# Patient Record
Sex: Female | Born: 1950 | Race: White | Hispanic: No | Smoking: Former smoker
Health system: Southern US, Community
[De-identification: ages and names within clinical notes are randomized; demographics above are authoritative.]

## PROBLEM LIST (undated history)

## (undated) DIAGNOSIS — M1990 Unspecified osteoarthritis, unspecified site: Secondary | ICD-10-CM

## (undated) DIAGNOSIS — I10 Essential (primary) hypertension: Secondary | ICD-10-CM

## (undated) DIAGNOSIS — N200 Calculus of kidney: Secondary | ICD-10-CM

## (undated) DIAGNOSIS — E039 Hypothyroidism, unspecified: Secondary | ICD-10-CM

## (undated) HISTORY — PX: TUBAL LIGATION: SHX77

## (undated) HISTORY — PX: KIDNEY STONE SURGERY: SHX686

## (undated) HISTORY — PX: TONSILLECTOMY: SUR1361

## (undated) HISTORY — PX: APPENDECTOMY: SHX54

## (undated) HISTORY — PX: OTHER SURGICAL HISTORY: SHX169

## (undated) HISTORY — PX: CHOLECYSTECTOMY: SHX55

---

## 1997-12-02 ENCOUNTER — Other Ambulatory Visit: Admission: RE | Admit: 1997-12-02 | Discharge: 1997-12-02 | Payer: Self-pay | Admitting: Internal Medicine

## 1997-12-05 ENCOUNTER — Emergency Department (HOSPITAL_COMMUNITY): Admission: EM | Admit: 1997-12-05 | Discharge: 1997-12-05 | Payer: Self-pay | Admitting: Emergency Medicine

## 1997-12-07 ENCOUNTER — Observation Stay (HOSPITAL_COMMUNITY): Admission: EM | Admit: 1997-12-07 | Discharge: 1997-12-08 | Payer: Self-pay | Admitting: Emergency Medicine

## 2001-06-27 ENCOUNTER — Other Ambulatory Visit: Admission: RE | Admit: 2001-06-27 | Discharge: 2001-06-27 | Payer: Self-pay | Admitting: Internal Medicine

## 2002-09-22 ENCOUNTER — Other Ambulatory Visit: Admission: RE | Admit: 2002-09-22 | Discharge: 2002-09-22 | Payer: Self-pay | Admitting: Internal Medicine

## 2005-08-02 ENCOUNTER — Ambulatory Visit: Payer: Self-pay | Admitting: Gastroenterology

## 2005-08-28 ENCOUNTER — Other Ambulatory Visit: Admission: RE | Admit: 2005-08-28 | Discharge: 2005-08-28 | Payer: Self-pay | Admitting: Internal Medicine

## 2005-09-26 ENCOUNTER — Ambulatory Visit (HOSPITAL_COMMUNITY): Admission: RE | Admit: 2005-09-26 | Discharge: 2005-09-26 | Payer: Self-pay | Admitting: *Deleted

## 2005-09-28 ENCOUNTER — Ambulatory Visit (HOSPITAL_COMMUNITY): Admission: RE | Admit: 2005-09-28 | Discharge: 2005-09-28 | Payer: Self-pay | Admitting: Gastroenterology

## 2006-08-26 ENCOUNTER — Other Ambulatory Visit: Admission: RE | Admit: 2006-08-26 | Discharge: 2006-08-26 | Payer: Self-pay | Admitting: Internal Medicine

## 2011-10-16 ENCOUNTER — Other Ambulatory Visit: Payer: Self-pay | Admitting: Internal Medicine

## 2011-10-16 DIAGNOSIS — B1920 Unspecified viral hepatitis C without hepatic coma: Secondary | ICD-10-CM

## 2011-10-19 ENCOUNTER — Ambulatory Visit
Admission: RE | Admit: 2011-10-19 | Discharge: 2011-10-19 | Disposition: A | Discharge status: Home / Self Care | Payer: No Typology Code available for payment source | Source: Ambulatory Visit | Attending: Internal Medicine | Admitting: Internal Medicine

## 2011-10-19 DIAGNOSIS — B1920 Unspecified viral hepatitis C without hepatic coma: Secondary | ICD-10-CM

## 2012-06-02 ENCOUNTER — Emergency Department (HOSPITAL_BASED_OUTPATIENT_CLINIC_OR_DEPARTMENT_OTHER)
Admission: EM | Admit: 2012-06-02 | Discharge: 2012-06-02 | Disposition: A | Discharge status: Home / Self Care | Payer: No Typology Code available for payment source | Attending: Emergency Medicine | Admitting: Emergency Medicine

## 2012-06-02 ENCOUNTER — Emergency Department (HOSPITAL_BASED_OUTPATIENT_CLINIC_OR_DEPARTMENT_OTHER): Payer: No Typology Code available for payment source

## 2012-06-02 ENCOUNTER — Encounter (HOSPITAL_BASED_OUTPATIENT_CLINIC_OR_DEPARTMENT_OTHER): Payer: Self-pay | Admitting: *Deleted

## 2012-06-02 DIAGNOSIS — Y9389 Activity, other specified: Secondary | ICD-10-CM | POA: Insufficient documentation

## 2012-06-02 DIAGNOSIS — S298XXA Other specified injuries of thorax, initial encounter: Secondary | ICD-10-CM | POA: Insufficient documentation

## 2012-06-02 DIAGNOSIS — I10 Essential (primary) hypertension: Secondary | ICD-10-CM | POA: Insufficient documentation

## 2012-06-02 DIAGNOSIS — Z79899 Other long term (current) drug therapy: Secondary | ICD-10-CM | POA: Insufficient documentation

## 2012-06-02 DIAGNOSIS — Y92410 Unspecified street and highway as the place of occurrence of the external cause: Secondary | ICD-10-CM | POA: Insufficient documentation

## 2012-06-02 DIAGNOSIS — S8990XA Unspecified injury of unspecified lower leg, initial encounter: Secondary | ICD-10-CM | POA: Insufficient documentation

## 2012-06-02 HISTORY — DX: Essential (primary) hypertension: I10

## 2012-06-02 MED ORDER — IBUPROFEN 600 MG PO TABS: 600.0000 mg | ORAL_TABLET | Freq: Four times a day (QID) | ORAL | Status: DC | PRN

## 2012-06-02 MED ORDER — IBUPROFEN 400 MG PO TABS
600.0000 mg | ORAL_TABLET | Freq: Once | ORAL | Status: AC
  Administered 2012-06-02: 600 mg via ORAL
  Filled 2012-06-02: qty 1

## 2012-06-02 MED ORDER — OXYCODONE-ACETAMINOPHEN 5-325 MG PO TABS: 2.0000 | ORAL_TABLET | ORAL | Status: DC | PRN

## 2012-06-02 NOTE — ED Notes (Signed)
No distress noted in Pt.    Pt. Has knee immobolizer on and ice pk in place.  Pt. Given instructions on use of immobolizer.

## 2012-06-02 NOTE — ED Provider Notes (Signed)
Medical screening examination/treatment/procedure(s) were performed by non-physician practitioner and as supervising physician I was immediately available for consultation/collaboration.   Charles B. Bernette Mayers, MD 06/02/12 1810
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
## 2012-12-25 ENCOUNTER — Other Ambulatory Visit: Payer: Self-pay | Admitting: Internal Medicine

## 2012-12-25 DIAGNOSIS — B1920 Unspecified viral hepatitis C without hepatic coma: Secondary | ICD-10-CM

## 2013-01-28 ENCOUNTER — Ambulatory Visit
Admission: RE | Admit: 2013-01-28 | Discharge: 2013-01-28 | Disposition: A | Discharge status: Home / Self Care | Payer: BC Managed Care – PPO | Source: Ambulatory Visit | Attending: Internal Medicine | Admitting: Internal Medicine

## 2013-01-28 DIAGNOSIS — B1920 Unspecified viral hepatitis C without hepatic coma: Secondary | ICD-10-CM

## 2014-01-06 ENCOUNTER — Other Ambulatory Visit: Payer: Self-pay | Admitting: Internal Medicine

## 2014-01-06 DIAGNOSIS — B1920 Unspecified viral hepatitis C without hepatic coma: Secondary | ICD-10-CM

## 2014-02-01 ENCOUNTER — Ambulatory Visit
Admission: RE | Admit: 2014-02-01 | Discharge: 2014-02-01 | Disposition: A | Discharge status: Home / Self Care | Payer: BC Managed Care – PPO | Source: Ambulatory Visit | Attending: Internal Medicine | Admitting: Internal Medicine

## 2014-02-01 DIAGNOSIS — B1920 Unspecified viral hepatitis C without hepatic coma: Secondary | ICD-10-CM

## 2014-06-14 ENCOUNTER — Other Ambulatory Visit: Payer: Self-pay | Admitting: Internal Medicine

## 2014-06-14 DIAGNOSIS — R748 Abnormal levels of other serum enzymes: Secondary | ICD-10-CM

## 2015-01-03 ENCOUNTER — Ambulatory Visit
Admission: RE | Admit: 2015-01-03 | Discharge: 2015-01-03 | Disposition: A | Discharge status: Home / Self Care | Payer: Self-pay | Source: Ambulatory Visit | Attending: Internal Medicine | Admitting: Internal Medicine

## 2015-01-03 DIAGNOSIS — R7309 Other abnormal glucose: Secondary | ICD-10-CM | POA: Diagnosis not present

## 2015-01-03 DIAGNOSIS — R7989 Other specified abnormal findings of blood chemistry: Secondary | ICD-10-CM | POA: Diagnosis not present

## 2015-01-03 DIAGNOSIS — R748 Abnormal levels of other serum enzymes: Secondary | ICD-10-CM

## 2015-01-06 DIAGNOSIS — I10 Essential (primary) hypertension: Secondary | ICD-10-CM | POA: Diagnosis not present

## 2015-01-10 DIAGNOSIS — L0390 Cellulitis, unspecified: Secondary | ICD-10-CM | POA: Diagnosis not present

## 2015-01-12 DIAGNOSIS — Z6833 Body mass index (BMI) 33.0-33.9, adult: Secondary | ICD-10-CM | POA: Diagnosis not present

## 2015-01-12 DIAGNOSIS — B1920 Unspecified viral hepatitis C without hepatic coma: Secondary | ICD-10-CM | POA: Diagnosis not present

## 2015-01-12 DIAGNOSIS — R7309 Other abnormal glucose: Secondary | ICD-10-CM | POA: Diagnosis not present

## 2015-01-12 DIAGNOSIS — I10 Essential (primary) hypertension: Secondary | ICD-10-CM | POA: Diagnosis not present

## 2015-01-12 DIAGNOSIS — L02214 Cutaneous abscess of groin: Secondary | ICD-10-CM | POA: Diagnosis not present

## 2015-01-24 DIAGNOSIS — L02214 Cutaneous abscess of groin: Secondary | ICD-10-CM | POA: Diagnosis not present

## 2015-01-24 DIAGNOSIS — Z6833 Body mass index (BMI) 33.0-33.9, adult: Secondary | ICD-10-CM | POA: Diagnosis not present

## 2015-01-24 DIAGNOSIS — I10 Essential (primary) hypertension: Secondary | ICD-10-CM | POA: Diagnosis not present

## 2015-01-24 DIAGNOSIS — Z5189 Encounter for other specified aftercare: Secondary | ICD-10-CM | POA: Diagnosis not present

## 2015-01-24 DIAGNOSIS — R7309 Other abnormal glucose: Secondary | ICD-10-CM | POA: Diagnosis not present

## 2015-01-24 DIAGNOSIS — B369 Superficial mycosis, unspecified: Secondary | ICD-10-CM | POA: Diagnosis not present

## 2015-02-16 DIAGNOSIS — B1920 Unspecified viral hepatitis C without hepatic coma: Secondary | ICD-10-CM | POA: Diagnosis not present

## 2015-02-23 DIAGNOSIS — B182 Chronic viral hepatitis C: Secondary | ICD-10-CM | POA: Diagnosis not present

## 2015-02-23 DIAGNOSIS — Z23 Encounter for immunization: Secondary | ICD-10-CM | POA: Diagnosis not present

## 2015-03-30 DIAGNOSIS — B182 Chronic viral hepatitis C: Secondary | ICD-10-CM | POA: Diagnosis not present

## 2015-04-06 DIAGNOSIS — B1920 Unspecified viral hepatitis C without hepatic coma: Secondary | ICD-10-CM | POA: Diagnosis not present

## 2015-05-25 DIAGNOSIS — R6889 Other general symptoms and signs: Secondary | ICD-10-CM | POA: Diagnosis not present

## 2015-06-03 DIAGNOSIS — B1920 Unspecified viral hepatitis C without hepatic coma: Secondary | ICD-10-CM | POA: Diagnosis not present

## 2015-06-07 DIAGNOSIS — Z23 Encounter for immunization: Secondary | ICD-10-CM | POA: Diagnosis not present

## 2015-06-07 DIAGNOSIS — I10 Essential (primary) hypertension: Secondary | ICD-10-CM | POA: Diagnosis not present

## 2015-06-07 DIAGNOSIS — E669 Obesity, unspecified: Secondary | ICD-10-CM | POA: Diagnosis not present

## 2015-06-07 DIAGNOSIS — B1920 Unspecified viral hepatitis C without hepatic coma: Secondary | ICD-10-CM | POA: Diagnosis not present

## 2015-07-28 DIAGNOSIS — I10 Essential (primary) hypertension: Secondary | ICD-10-CM | POA: Diagnosis not present

## 2015-07-28 DIAGNOSIS — B1920 Unspecified viral hepatitis C without hepatic coma: Secondary | ICD-10-CM | POA: Diagnosis not present

## 2015-08-04 DIAGNOSIS — I10 Essential (primary) hypertension: Secondary | ICD-10-CM | POA: Diagnosis not present

## 2015-11-24 DIAGNOSIS — H5210 Myopia, unspecified eye: Secondary | ICD-10-CM | POA: Diagnosis not present

## 2015-11-24 DIAGNOSIS — H5213 Myopia, bilateral: Secondary | ICD-10-CM | POA: Diagnosis not present

## 2015-12-14 DIAGNOSIS — H25811 Combined forms of age-related cataract, right eye: Secondary | ICD-10-CM | POA: Diagnosis not present

## 2015-12-14 DIAGNOSIS — H25812 Combined forms of age-related cataract, left eye: Secondary | ICD-10-CM | POA: Diagnosis not present

## 2015-12-14 DIAGNOSIS — H25813 Combined forms of age-related cataract, bilateral: Secondary | ICD-10-CM | POA: Diagnosis not present

## 2016-01-23 DIAGNOSIS — E559 Vitamin D deficiency, unspecified: Secondary | ICD-10-CM | POA: Diagnosis not present

## 2016-01-23 DIAGNOSIS — B1920 Unspecified viral hepatitis C without hepatic coma: Secondary | ICD-10-CM | POA: Diagnosis not present

## 2016-01-23 DIAGNOSIS — N390 Urinary tract infection, site not specified: Secondary | ICD-10-CM | POA: Diagnosis not present

## 2016-01-23 DIAGNOSIS — I10 Essential (primary) hypertension: Secondary | ICD-10-CM | POA: Diagnosis not present

## 2016-01-23 DIAGNOSIS — Z0000 Encounter for general adult medical examination without abnormal findings: Secondary | ICD-10-CM | POA: Diagnosis not present

## 2016-02-23 DIAGNOSIS — H2512 Age-related nuclear cataract, left eye: Secondary | ICD-10-CM | POA: Diagnosis not present

## 2016-02-23 DIAGNOSIS — H25812 Combined forms of age-related cataract, left eye: Secondary | ICD-10-CM | POA: Diagnosis not present

## 2016-03-02 DIAGNOSIS — R739 Hyperglycemia, unspecified: Secondary | ICD-10-CM | POA: Diagnosis not present

## 2016-03-02 DIAGNOSIS — Z0000 Encounter for general adult medical examination without abnormal findings: Secondary | ICD-10-CM | POA: Diagnosis not present

## 2016-03-02 DIAGNOSIS — E559 Vitamin D deficiency, unspecified: Secondary | ICD-10-CM | POA: Diagnosis not present

## 2016-03-02 DIAGNOSIS — Z23 Encounter for immunization: Secondary | ICD-10-CM | POA: Diagnosis not present

## 2016-03-26 DIAGNOSIS — H2511 Age-related nuclear cataract, right eye: Secondary | ICD-10-CM | POA: Diagnosis not present

## 2016-03-26 DIAGNOSIS — E119 Type 2 diabetes mellitus without complications: Secondary | ICD-10-CM | POA: Diagnosis not present

## 2016-03-26 DIAGNOSIS — H25811 Combined forms of age-related cataract, right eye: Secondary | ICD-10-CM | POA: Diagnosis not present

## 2016-06-28 DIAGNOSIS — H52221 Regular astigmatism, right eye: Secondary | ICD-10-CM | POA: Diagnosis not present

## 2016-06-28 DIAGNOSIS — H5213 Myopia, bilateral: Secondary | ICD-10-CM | POA: Diagnosis not present

## 2016-06-28 DIAGNOSIS — Z9849 Cataract extraction status, unspecified eye: Secondary | ICD-10-CM | POA: Diagnosis not present

## 2016-06-28 DIAGNOSIS — H524 Presbyopia: Secondary | ICD-10-CM | POA: Diagnosis not present

## 2016-06-28 DIAGNOSIS — Z0000 Encounter for general adult medical examination without abnormal findings: Secondary | ICD-10-CM | POA: Diagnosis not present

## 2016-06-28 DIAGNOSIS — R739 Hyperglycemia, unspecified: Secondary | ICD-10-CM | POA: Diagnosis not present

## 2016-06-28 DIAGNOSIS — Z961 Presence of intraocular lens: Secondary | ICD-10-CM | POA: Diagnosis not present

## 2016-06-28 DIAGNOSIS — E559 Vitamin D deficiency, unspecified: Secondary | ICD-10-CM | POA: Diagnosis not present

## 2016-07-03 DIAGNOSIS — E559 Vitamin D deficiency, unspecified: Secondary | ICD-10-CM | POA: Diagnosis not present

## 2016-07-03 DIAGNOSIS — I10 Essential (primary) hypertension: Secondary | ICD-10-CM | POA: Diagnosis not present

## 2016-07-03 DIAGNOSIS — Z0000 Encounter for general adult medical examination without abnormal findings: Secondary | ICD-10-CM | POA: Diagnosis not present

## 2016-07-03 DIAGNOSIS — Z780 Asymptomatic menopausal state: Secondary | ICD-10-CM | POA: Diagnosis not present

## 2016-08-20 ENCOUNTER — Other Ambulatory Visit: Payer: Self-pay | Admitting: Internal Medicine

## 2016-08-20 DIAGNOSIS — Z1231 Encounter for screening mammogram for malignant neoplasm of breast: Secondary | ICD-10-CM

## 2016-09-06 ENCOUNTER — Ambulatory Visit
Admission: RE | Admit: 2016-09-06 | Discharge: 2016-09-06 | Disposition: A | Discharge status: Home / Self Care | Payer: Commercial Managed Care - HMO | Source: Ambulatory Visit | Attending: Internal Medicine | Admitting: Internal Medicine

## 2016-09-06 DIAGNOSIS — Z1231 Encounter for screening mammogram for malignant neoplasm of breast: Secondary | ICD-10-CM

## 2016-09-07 ENCOUNTER — Other Ambulatory Visit: Payer: Self-pay | Admitting: Internal Medicine

## 2016-09-07 DIAGNOSIS — R928 Other abnormal and inconclusive findings on diagnostic imaging of breast: Secondary | ICD-10-CM

## 2016-09-12 ENCOUNTER — Other Ambulatory Visit: Payer: Commercial Managed Care - HMO

## 2016-09-13 ENCOUNTER — Ambulatory Visit
Admission: RE | Admit: 2016-09-13 | Discharge: 2016-09-13 | Disposition: A | Discharge status: Home / Self Care | Payer: Commercial Managed Care - HMO | Source: Ambulatory Visit | Attending: Internal Medicine | Admitting: Internal Medicine

## 2016-09-13 ENCOUNTER — Other Ambulatory Visit: Payer: Self-pay | Admitting: Internal Medicine

## 2016-09-13 DIAGNOSIS — R928 Other abnormal and inconclusive findings on diagnostic imaging of breast: Secondary | ICD-10-CM | POA: Diagnosis not present

## 2016-09-13 DIAGNOSIS — N6489 Other specified disorders of breast: Secondary | ICD-10-CM | POA: Diagnosis not present

## 2016-09-13 DIAGNOSIS — N6310 Unspecified lump in the right breast, unspecified quadrant: Secondary | ICD-10-CM

## 2016-09-19 ENCOUNTER — Ambulatory Visit
Admission: RE | Admit: 2016-09-19 | Discharge: 2016-09-19 | Disposition: A | Discharge status: Home / Self Care | Payer: Commercial Managed Care - HMO | Source: Ambulatory Visit | Attending: Internal Medicine | Admitting: Internal Medicine

## 2016-09-19 ENCOUNTER — Other Ambulatory Visit: Payer: Self-pay | Admitting: Internal Medicine

## 2016-09-19 DIAGNOSIS — N6489 Other specified disorders of breast: Secondary | ICD-10-CM

## 2016-09-19 DIAGNOSIS — N6310 Unspecified lump in the right breast, unspecified quadrant: Secondary | ICD-10-CM

## 2016-09-19 DIAGNOSIS — N6311 Unspecified lump in the right breast, upper outer quadrant: Secondary | ICD-10-CM | POA: Diagnosis not present

## 2016-09-19 DIAGNOSIS — D241 Benign neoplasm of right breast: Secondary | ICD-10-CM | POA: Diagnosis not present

## 2016-10-15 DIAGNOSIS — I10 Essential (primary) hypertension: Secondary | ICD-10-CM | POA: Diagnosis not present

## 2016-10-15 DIAGNOSIS — R928 Other abnormal and inconclusive findings on diagnostic imaging of breast: Secondary | ICD-10-CM | POA: Diagnosis not present

## 2016-10-15 DIAGNOSIS — R7303 Prediabetes: Secondary | ICD-10-CM | POA: Diagnosis not present

## 2016-10-15 DIAGNOSIS — Z6833 Body mass index (BMI) 33.0-33.9, adult: Secondary | ICD-10-CM | POA: Diagnosis not present

## 2016-10-23 IMAGING — US US ABDOMEN COMPLETE
1 series · 14 of 25 positions shown · non-contrast
Comparison: 02/01/2014

CLINICAL DATA: Abnormal LFTs and blood sugar. Previous
cholecystectomy.

EXAM:
ULTRASOUND ABDOMEN COMPLETE

[Series 1: us abdomen complete · 0.24mm/px · 14 of 59 slices shown]
[im 1/59]
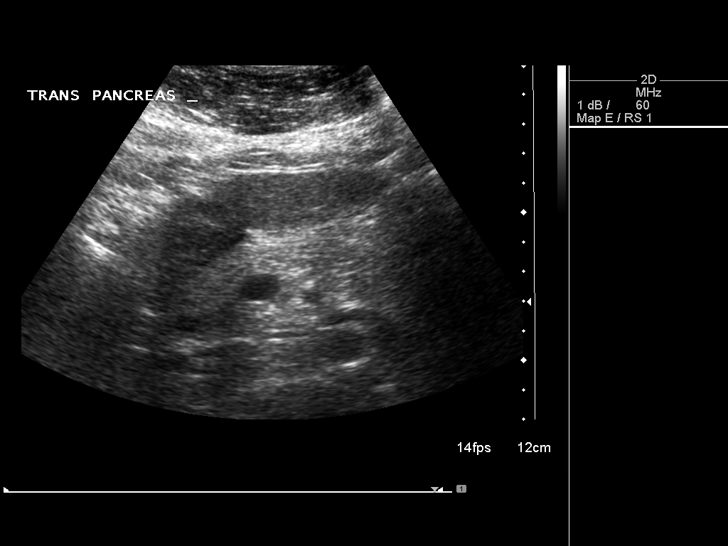
[im 5/59]
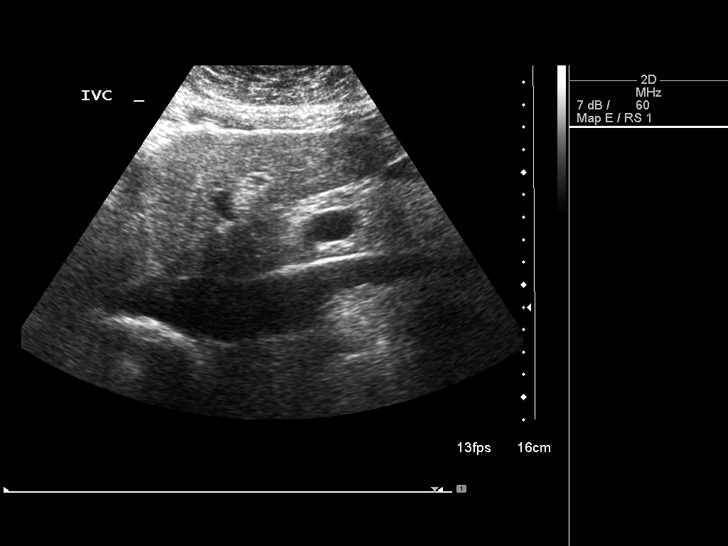
[im 10/59]
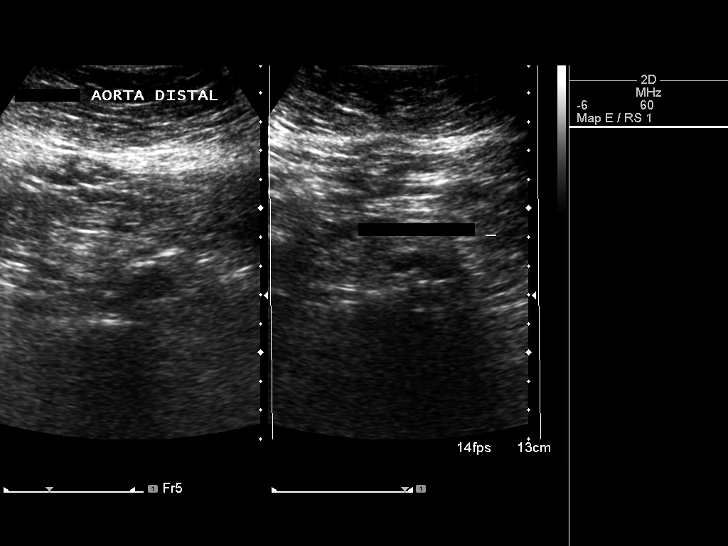
[im 15/59]
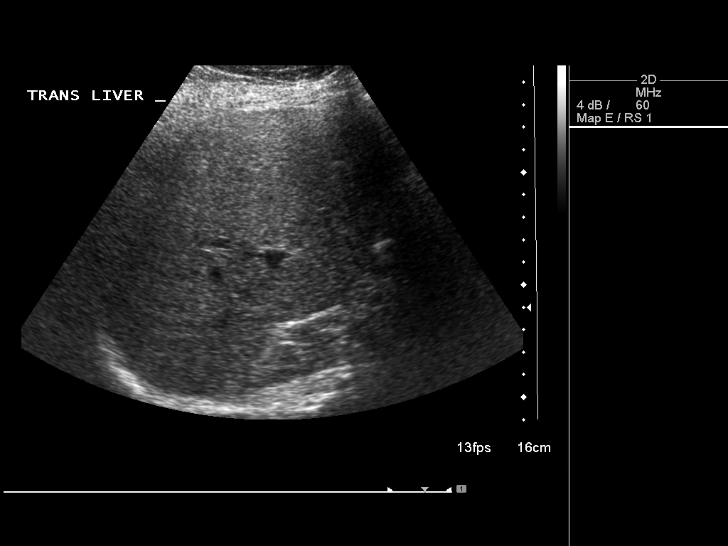
[im 20/59]
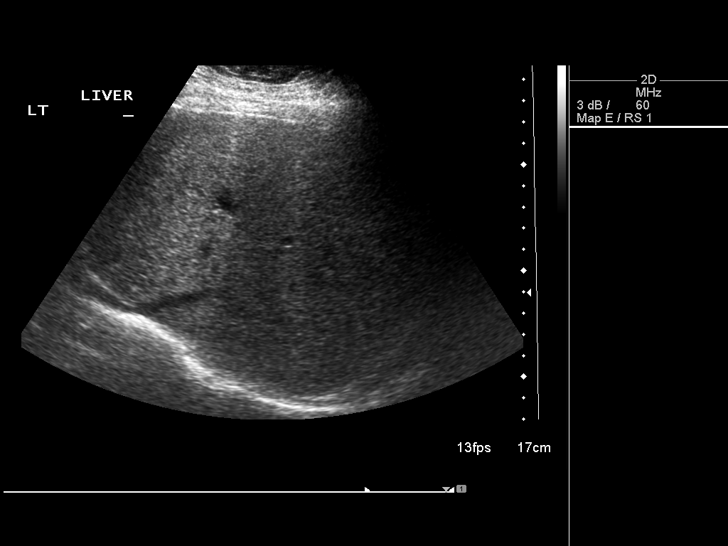
[im 22/59]
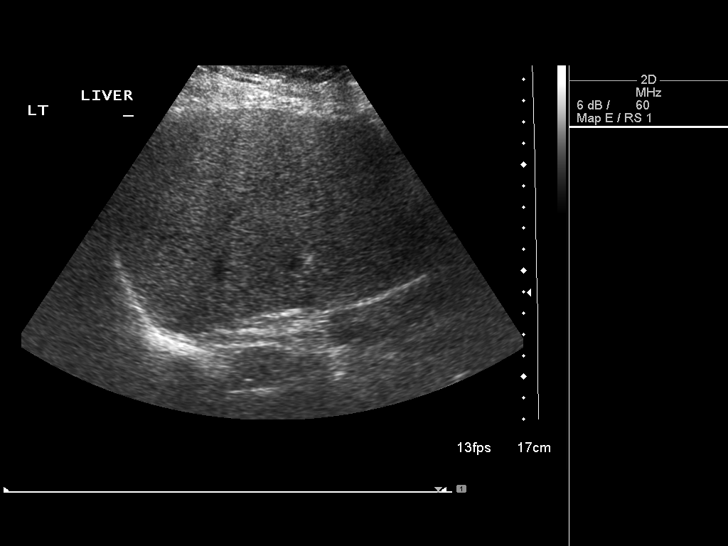
[im 27/59]
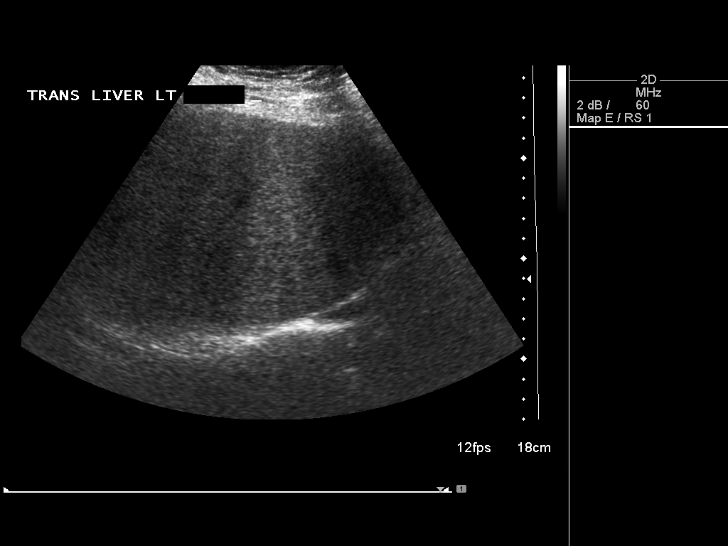
[im 32/59]
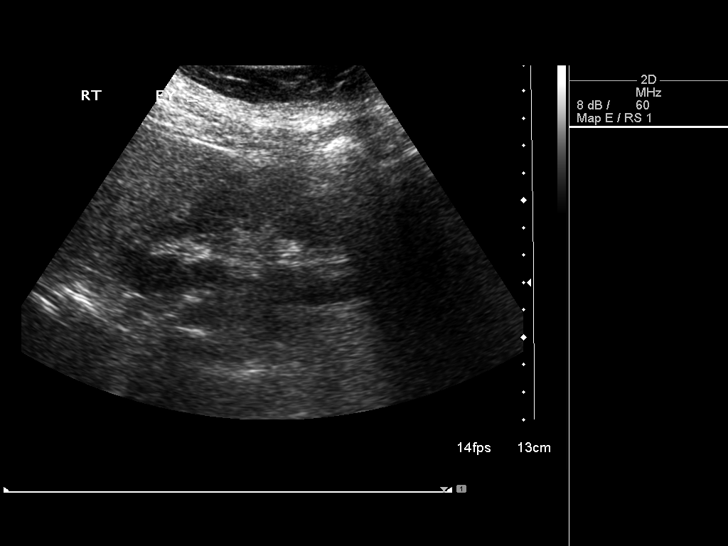
[im 37/59]
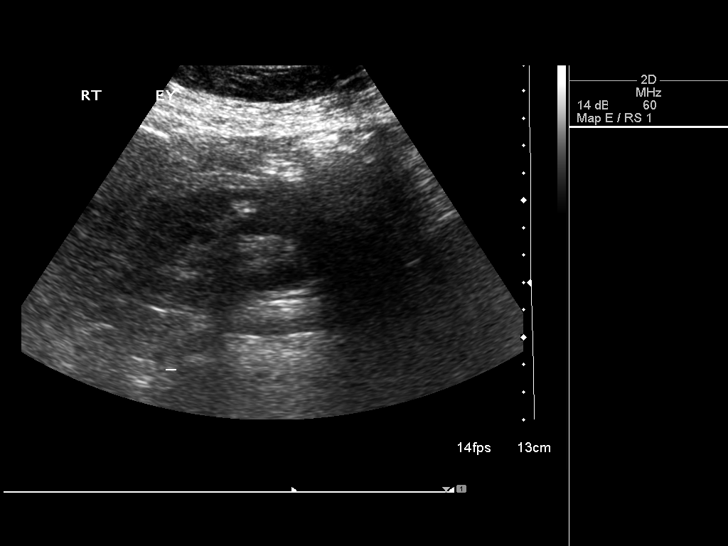
[im 39/59]
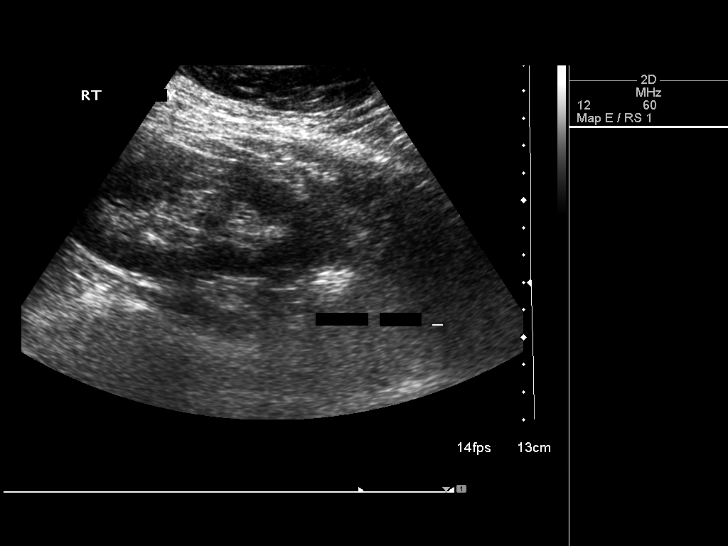
[im 44/59]
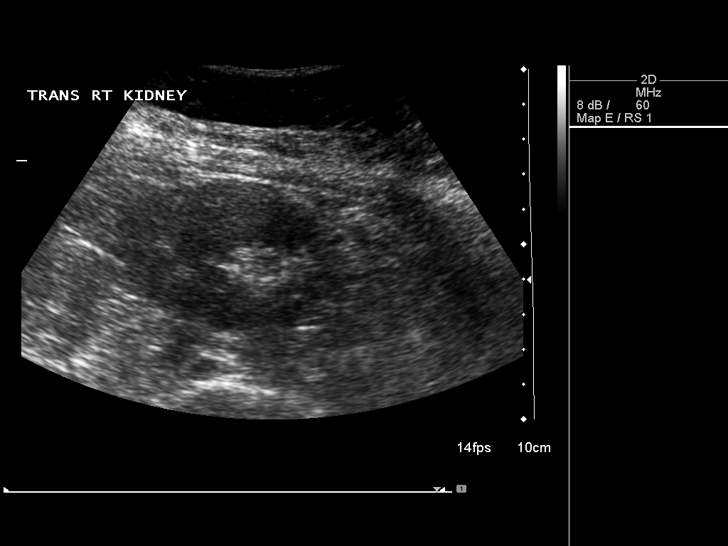
[im 49/59]
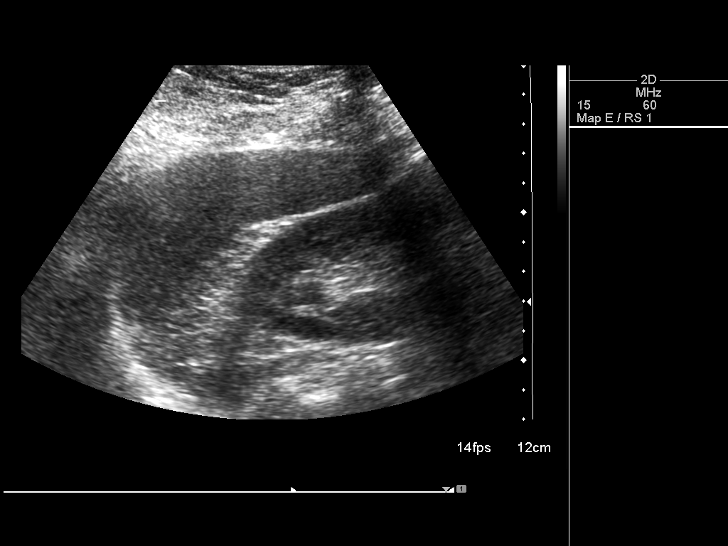
[im 54/59]
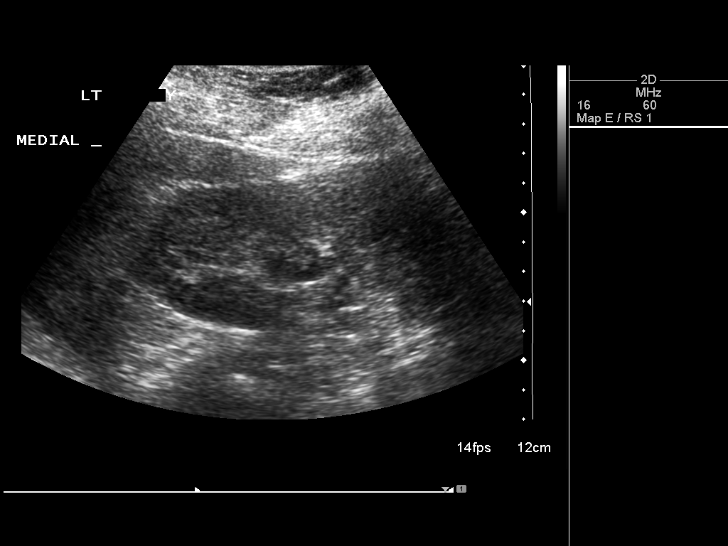
[im 59/59]
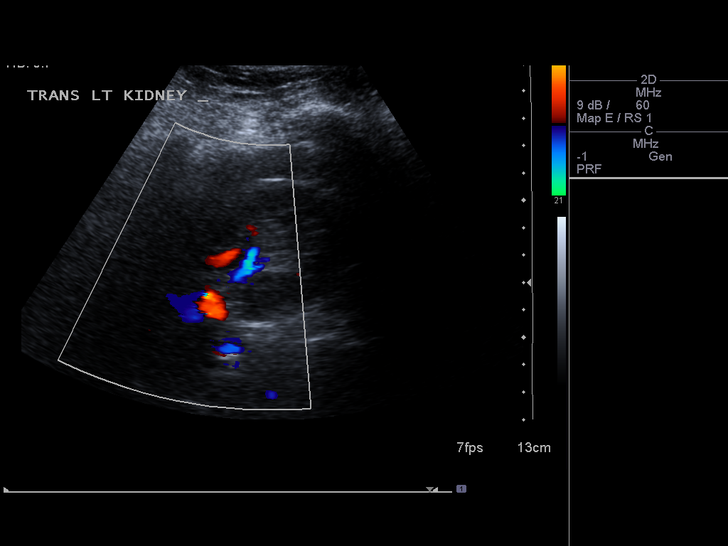

[14 of 25 positions shown; findings below may reference images not displayed]

FINDINGS: Gallbladder: Previous cholecystectomy.

Common bile duct: Diameter: 3.6 mm.

Liver: Mild diffuse coarse echogenicity without focal mass.

IVC: No abnormality visualized.

Pancreas: Visualized portion unremarkable.

Spleen: Size and appearance within normal limits.

Right Kidney: Length: 11.1 cm. Echogenicity within normal limits. No
mass or hydronephrosis visualized. Findings suggesting
nephrolithiasis with the largest stone over the upper pole measuring
1.2 cm unchanged.

Left Kidney: Length: 12.7 cm. Echogenicity within normal limits. No
mass or hydronephrosis visualized.

Abdominal aorta: No aneurysm visualized.

Other findings: None.
IMPRESSION: No acute hepatobiliary disease.  Previous cholecystectomy.

Findings suggesting right-sided nephrolithiasis unchanged. No
hydronephrosis.

Stable mild coarse echogenicity of the liver without focal mass.

## 2017-01-02 DIAGNOSIS — R197 Diarrhea, unspecified: Secondary | ICD-10-CM | POA: Diagnosis not present

## 2017-01-24 DIAGNOSIS — Z780 Asymptomatic menopausal state: Secondary | ICD-10-CM | POA: Diagnosis not present

## 2017-01-24 DIAGNOSIS — N390 Urinary tract infection, site not specified: Secondary | ICD-10-CM | POA: Diagnosis not present

## 2017-01-24 DIAGNOSIS — E559 Vitamin D deficiency, unspecified: Secondary | ICD-10-CM | POA: Diagnosis not present

## 2017-01-24 DIAGNOSIS — Z0000 Encounter for general adult medical examination without abnormal findings: Secondary | ICD-10-CM | POA: Diagnosis not present

## 2017-01-24 DIAGNOSIS — Z23 Encounter for immunization: Secondary | ICD-10-CM | POA: Diagnosis not present

## 2017-01-24 DIAGNOSIS — I10 Essential (primary) hypertension: Secondary | ICD-10-CM | POA: Diagnosis not present

## 2017-01-30 DIAGNOSIS — E559 Vitamin D deficiency, unspecified: Secondary | ICD-10-CM | POA: Diagnosis not present

## 2017-01-30 DIAGNOSIS — R42 Dizziness and giddiness: Secondary | ICD-10-CM | POA: Diagnosis not present

## 2017-01-30 DIAGNOSIS — Z0000 Encounter for general adult medical examination without abnormal findings: Secondary | ICD-10-CM | POA: Diagnosis not present

## 2018-02-25 DIAGNOSIS — I10 Essential (primary) hypertension: Secondary | ICD-10-CM | POA: Diagnosis not present

## 2018-02-25 DIAGNOSIS — Z0000 Encounter for general adult medical examination without abnormal findings: Secondary | ICD-10-CM | POA: Diagnosis not present

## 2018-02-25 DIAGNOSIS — E559 Vitamin D deficiency, unspecified: Secondary | ICD-10-CM | POA: Diagnosis not present

## 2018-02-25 DIAGNOSIS — N390 Urinary tract infection, site not specified: Secondary | ICD-10-CM | POA: Diagnosis not present

## 2018-03-04 DIAGNOSIS — E038 Other specified hypothyroidism: Secondary | ICD-10-CM | POA: Diagnosis not present

## 2018-03-04 DIAGNOSIS — I10 Essential (primary) hypertension: Secondary | ICD-10-CM | POA: Diagnosis not present

## 2018-03-04 DIAGNOSIS — E559 Vitamin D deficiency, unspecified: Secondary | ICD-10-CM | POA: Diagnosis not present

## 2018-03-04 DIAGNOSIS — R42 Dizziness and giddiness: Secondary | ICD-10-CM | POA: Diagnosis not present

## 2018-03-04 DIAGNOSIS — Z1211 Encounter for screening for malignant neoplasm of colon: Secondary | ICD-10-CM | POA: Diagnosis not present

## 2018-03-04 DIAGNOSIS — Z0000 Encounter for general adult medical examination without abnormal findings: Secondary | ICD-10-CM | POA: Diagnosis not present

## 2018-03-04 DIAGNOSIS — R739 Hyperglycemia, unspecified: Secondary | ICD-10-CM | POA: Diagnosis not present

## 2018-03-04 DIAGNOSIS — Z780 Asymptomatic menopausal state: Secondary | ICD-10-CM | POA: Diagnosis not present

## 2018-03-25 DIAGNOSIS — Z1211 Encounter for screening for malignant neoplasm of colon: Secondary | ICD-10-CM | POA: Diagnosis not present

## 2018-03-25 DIAGNOSIS — Z1212 Encounter for screening for malignant neoplasm of rectum: Secondary | ICD-10-CM | POA: Diagnosis not present

## 2018-08-27 DIAGNOSIS — I10 Essential (primary) hypertension: Secondary | ICD-10-CM | POA: Diagnosis not present

## 2018-08-27 DIAGNOSIS — E559 Vitamin D deficiency, unspecified: Secondary | ICD-10-CM | POA: Diagnosis not present

## 2018-08-27 DIAGNOSIS — E038 Other specified hypothyroidism: Secondary | ICD-10-CM | POA: Diagnosis not present

## 2018-08-27 DIAGNOSIS — R739 Hyperglycemia, unspecified: Secondary | ICD-10-CM | POA: Diagnosis not present

## 2018-09-03 DIAGNOSIS — I10 Essential (primary) hypertension: Secondary | ICD-10-CM | POA: Diagnosis not present

## 2018-09-03 DIAGNOSIS — E038 Other specified hypothyroidism: Secondary | ICD-10-CM | POA: Diagnosis not present

## 2019-02-24 DIAGNOSIS — R739 Hyperglycemia, unspecified: Secondary | ICD-10-CM | POA: Diagnosis not present

## 2019-02-24 DIAGNOSIS — N390 Urinary tract infection, site not specified: Secondary | ICD-10-CM | POA: Diagnosis not present

## 2019-02-24 DIAGNOSIS — E559 Vitamin D deficiency, unspecified: Secondary | ICD-10-CM | POA: Diagnosis not present

## 2019-02-24 DIAGNOSIS — I10 Essential (primary) hypertension: Secondary | ICD-10-CM | POA: Diagnosis not present

## 2019-02-24 DIAGNOSIS — E039 Hypothyroidism, unspecified: Secondary | ICD-10-CM | POA: Diagnosis not present

## 2019-02-24 DIAGNOSIS — E785 Hyperlipidemia, unspecified: Secondary | ICD-10-CM | POA: Diagnosis not present

## 2019-03-16 DIAGNOSIS — E785 Hyperlipidemia, unspecified: Secondary | ICD-10-CM | POA: Diagnosis not present

## 2019-03-16 DIAGNOSIS — I10 Essential (primary) hypertension: Secondary | ICD-10-CM | POA: Diagnosis not present

## 2019-03-16 DIAGNOSIS — Z0000 Encounter for general adult medical examination without abnormal findings: Secondary | ICD-10-CM | POA: Diagnosis not present

## 2019-03-16 DIAGNOSIS — E039 Hypothyroidism, unspecified: Secondary | ICD-10-CM | POA: Diagnosis not present

## 2019-03-16 DIAGNOSIS — K219 Gastro-esophageal reflux disease without esophagitis: Secondary | ICD-10-CM | POA: Diagnosis not present

## 2019-04-28 ENCOUNTER — Other Ambulatory Visit: Payer: Self-pay | Admitting: Internal Medicine

## 2019-04-28 DIAGNOSIS — Z1231 Encounter for screening mammogram for malignant neoplasm of breast: Secondary | ICD-10-CM

## 2019-06-09 DIAGNOSIS — E039 Hypothyroidism, unspecified: Secondary | ICD-10-CM | POA: Diagnosis not present

## 2019-06-09 DIAGNOSIS — I10 Essential (primary) hypertension: Secondary | ICD-10-CM | POA: Diagnosis not present

## 2019-06-11 ENCOUNTER — Other Ambulatory Visit: Payer: Self-pay | Admitting: Internal Medicine

## 2019-06-11 ENCOUNTER — Other Ambulatory Visit (HOSPITAL_COMMUNITY): Payer: Self-pay | Admitting: Internal Medicine

## 2019-06-11 DIAGNOSIS — E785 Hyperlipidemia, unspecified: Secondary | ICD-10-CM | POA: Diagnosis not present

## 2019-06-11 DIAGNOSIS — I10 Essential (primary) hypertension: Secondary | ICD-10-CM | POA: Diagnosis not present

## 2019-06-11 DIAGNOSIS — E039 Hypothyroidism, unspecified: Secondary | ICD-10-CM | POA: Diagnosis not present

## 2019-06-11 DIAGNOSIS — R42 Dizziness and giddiness: Secondary | ICD-10-CM

## 2019-06-11 DIAGNOSIS — K219 Gastro-esophageal reflux disease without esophagitis: Secondary | ICD-10-CM | POA: Diagnosis not present

## 2019-06-11 DIAGNOSIS — L0390 Cellulitis, unspecified: Secondary | ICD-10-CM | POA: Diagnosis not present

## 2019-06-17 ENCOUNTER — Ambulatory Visit
Admission: RE | Admit: 2019-06-17 | Discharge: 2019-06-17 | Disposition: A | Discharge status: Home / Self Care | Payer: Medicare HMO | Source: Ambulatory Visit | Attending: Internal Medicine | Admitting: Internal Medicine

## 2019-06-17 ENCOUNTER — Other Ambulatory Visit: Payer: Self-pay

## 2019-06-17 DIAGNOSIS — Z1231 Encounter for screening mammogram for malignant neoplasm of breast: Secondary | ICD-10-CM

## 2019-06-23 ENCOUNTER — Other Ambulatory Visit: Payer: Self-pay

## 2019-06-23 ENCOUNTER — Ambulatory Visit (HOSPITAL_COMMUNITY)
Admission: RE | Admit: 2019-06-23 | Discharge: 2019-06-23 | Disposition: A | Discharge status: Home / Self Care | Payer: Medicare HMO | Source: Ambulatory Visit | Attending: Internal Medicine | Admitting: Internal Medicine

## 2019-06-23 DIAGNOSIS — R42 Dizziness and giddiness: Secondary | ICD-10-CM | POA: Diagnosis not present

## 2019-06-24 DIAGNOSIS — I10 Essential (primary) hypertension: Secondary | ICD-10-CM | POA: Diagnosis not present

## 2019-06-24 DIAGNOSIS — R42 Dizziness and giddiness: Secondary | ICD-10-CM | POA: Diagnosis not present

## 2019-06-24 DIAGNOSIS — E039 Hypothyroidism, unspecified: Secondary | ICD-10-CM | POA: Diagnosis not present

## 2019-09-09 DIAGNOSIS — E039 Hypothyroidism, unspecified: Secondary | ICD-10-CM | POA: Diagnosis not present

## 2019-09-09 DIAGNOSIS — E785 Hyperlipidemia, unspecified: Secondary | ICD-10-CM | POA: Diagnosis not present

## 2019-09-17 DIAGNOSIS — R739 Hyperglycemia, unspecified: Secondary | ICD-10-CM | POA: Diagnosis not present

## 2019-09-17 DIAGNOSIS — E039 Hypothyroidism, unspecified: Secondary | ICD-10-CM | POA: Diagnosis not present

## 2019-09-17 DIAGNOSIS — K219 Gastro-esophageal reflux disease without esophagitis: Secondary | ICD-10-CM | POA: Diagnosis not present

## 2019-09-17 DIAGNOSIS — I10 Essential (primary) hypertension: Secondary | ICD-10-CM | POA: Diagnosis not present

## 2019-09-17 DIAGNOSIS — Z780 Asymptomatic menopausal state: Secondary | ICD-10-CM | POA: Diagnosis not present

## 2019-09-17 DIAGNOSIS — E785 Hyperlipidemia, unspecified: Secondary | ICD-10-CM | POA: Diagnosis not present

## 2020-01-06 DIAGNOSIS — Z0100 Encounter for examination of eyes and vision without abnormal findings: Secondary | ICD-10-CM | POA: Diagnosis not present

## 2020-01-06 DIAGNOSIS — H524 Presbyopia: Secondary | ICD-10-CM | POA: Diagnosis not present

## 2020-02-04 DIAGNOSIS — Z23 Encounter for immunization: Secondary | ICD-10-CM | POA: Diagnosis not present

## 2020-03-10 DIAGNOSIS — E039 Hypothyroidism, unspecified: Secondary | ICD-10-CM | POA: Diagnosis not present

## 2020-03-10 DIAGNOSIS — N390 Urinary tract infection, site not specified: Secondary | ICD-10-CM | POA: Diagnosis not present

## 2020-03-10 DIAGNOSIS — I10 Essential (primary) hypertension: Secondary | ICD-10-CM | POA: Diagnosis not present

## 2020-03-10 DIAGNOSIS — E785 Hyperlipidemia, unspecified: Secondary | ICD-10-CM | POA: Diagnosis not present

## 2020-03-17 DIAGNOSIS — K219 Gastro-esophageal reflux disease without esophagitis: Secondary | ICD-10-CM | POA: Diagnosis not present

## 2020-03-17 DIAGNOSIS — I10 Essential (primary) hypertension: Secondary | ICD-10-CM | POA: Diagnosis not present

## 2020-03-17 DIAGNOSIS — N390 Urinary tract infection, site not specified: Secondary | ICD-10-CM | POA: Diagnosis not present

## 2020-03-17 DIAGNOSIS — E559 Vitamin D deficiency, unspecified: Secondary | ICD-10-CM | POA: Diagnosis not present

## 2020-03-17 DIAGNOSIS — E039 Hypothyroidism, unspecified: Secondary | ICD-10-CM | POA: Diagnosis not present

## 2020-03-17 DIAGNOSIS — Z0000 Encounter for general adult medical examination without abnormal findings: Secondary | ICD-10-CM | POA: Diagnosis not present

## 2020-06-06 DIAGNOSIS — Z111 Encounter for screening for respiratory tuberculosis: Secondary | ICD-10-CM | POA: Diagnosis not present

## 2020-09-20 ENCOUNTER — Emergency Department (HOSPITAL_BASED_OUTPATIENT_CLINIC_OR_DEPARTMENT_OTHER): Payer: Medicare HMO

## 2020-09-20 ENCOUNTER — Emergency Department (HOSPITAL_BASED_OUTPATIENT_CLINIC_OR_DEPARTMENT_OTHER)
Admission: EM | Admit: 2020-09-20 | Discharge: 2020-09-20 | Disposition: A | Discharge status: Home / Self Care | Payer: Medicare HMO | Attending: Emergency Medicine | Admitting: Emergency Medicine

## 2020-09-20 ENCOUNTER — Other Ambulatory Visit (HOSPITAL_BASED_OUTPATIENT_CLINIC_OR_DEPARTMENT_OTHER): Payer: Self-pay

## 2020-09-20 ENCOUNTER — Other Ambulatory Visit: Payer: Self-pay

## 2020-09-20 ENCOUNTER — Encounter (HOSPITAL_BASED_OUTPATIENT_CLINIC_OR_DEPARTMENT_OTHER): Payer: Self-pay

## 2020-09-20 DIAGNOSIS — Y92410 Unspecified street and highway as the place of occurrence of the external cause: Secondary | ICD-10-CM | POA: Diagnosis not present

## 2020-09-20 DIAGNOSIS — R079 Chest pain, unspecified: Secondary | ICD-10-CM | POA: Diagnosis not present

## 2020-09-20 DIAGNOSIS — K5730 Diverticulosis of large intestine without perforation or abscess without bleeding: Secondary | ICD-10-CM | POA: Diagnosis not present

## 2020-09-20 DIAGNOSIS — K449 Diaphragmatic hernia without obstruction or gangrene: Secondary | ICD-10-CM | POA: Diagnosis not present

## 2020-09-20 DIAGNOSIS — M79661 Pain in right lower leg: Secondary | ICD-10-CM | POA: Insufficient documentation

## 2020-09-20 DIAGNOSIS — N200 Calculus of kidney: Secondary | ICD-10-CM | POA: Diagnosis not present

## 2020-09-20 DIAGNOSIS — M79604 Pain in right leg: Secondary | ICD-10-CM | POA: Diagnosis not present

## 2020-09-20 DIAGNOSIS — I10 Essential (primary) hypertension: Secondary | ICD-10-CM | POA: Diagnosis not present

## 2020-09-20 DIAGNOSIS — M25512 Pain in left shoulder: Secondary | ICD-10-CM | POA: Diagnosis not present

## 2020-09-20 DIAGNOSIS — Z79899 Other long term (current) drug therapy: Secondary | ICD-10-CM | POA: Insufficient documentation

## 2020-09-20 DIAGNOSIS — M542 Cervicalgia: Secondary | ICD-10-CM | POA: Diagnosis not present

## 2020-09-20 DIAGNOSIS — I2510 Atherosclerotic heart disease of native coronary artery without angina pectoris: Secondary | ICD-10-CM | POA: Diagnosis not present

## 2020-09-20 DIAGNOSIS — S299XXA Unspecified injury of thorax, initial encounter: Secondary | ICD-10-CM | POA: Diagnosis not present

## 2020-09-20 DIAGNOSIS — S3991XA Unspecified injury of abdomen, initial encounter: Secondary | ICD-10-CM | POA: Diagnosis not present

## 2020-09-20 HISTORY — DX: Calculus of kidney: N20.0

## 2020-09-20 LAB — CBC WITH DIFFERENTIAL/PLATELET
Abs Immature Granulocytes: 0.04 10*3/uL (ref 0.00–0.07)
Basophils Absolute: 0 10*3/uL (ref 0.0–0.1)
Basophils Relative: 0 %
Eosinophils Absolute: 0.2 10*3/uL (ref 0.0–0.5)
Eosinophils Relative: 2 %
HCT: 40.3 % (ref 36.0–46.0)
Hemoglobin: 13.7 g/dL (ref 12.0–15.0)
Immature Granulocytes: 0 %
Lymphocytes Relative: 16 %
Lymphs Abs: 1.6 10*3/uL (ref 0.7–4.0)
MCH: 30.3 pg (ref 26.0–34.0)
MCHC: 34 g/dL (ref 30.0–36.0)
MCV: 89.2 fL (ref 80.0–100.0)
Monocytes Absolute: 0.8 10*3/uL (ref 0.1–1.0)
Monocytes Relative: 8 %
Neutro Abs: 7.2 10*3/uL (ref 1.7–7.7)
Neutrophils Relative %: 74 %
Platelets: 186 10*3/uL (ref 150–400)
RBC: 4.52 MIL/uL (ref 3.87–5.11)
RDW: 13.6 % (ref 11.5–15.5)
WBC: 9.8 10*3/uL (ref 4.0–10.5)
nRBC: 0 % (ref 0.0–0.2)

## 2020-09-20 LAB — COMPREHENSIVE METABOLIC PANEL
ALT: 14 U/L (ref 0–44)
AST: 17 U/L (ref 15–41)
Albumin: 3.9 g/dL (ref 3.5–5.0)
Alkaline Phosphatase: 58 U/L (ref 38–126)
Anion gap: 8 (ref 5–15)
BUN: 27 mg/dL — ABNORMAL HIGH (ref 8–23)
CO2: 25 mmol/L (ref 22–32)
Calcium: 8.7 mg/dL — ABNORMAL LOW (ref 8.9–10.3)
Chloride: 104 mmol/L (ref 98–111)
Creatinine, Ser: 0.84 mg/dL (ref 0.44–1.00)
GFR, Estimated: >60 mL/min (ref >60)
Glucose, Bld: 122 mg/dL — ABNORMAL HIGH (ref 70–99)
Potassium: 3.9 mmol/L (ref 3.5–5.1)
Sodium: 137 mmol/L (ref 135–145)
Total Bilirubin: 0.6 mg/dL (ref 0.3–1.2)
Total Protein: 7.9 g/dL (ref 6.5–8.1)

## 2020-09-20 LAB — TROPONIN I (HIGH SENSITIVITY): Troponin I (High Sensitivity): 3 ng/L (ref <18)

## 2020-09-20 MED ORDER — OXYCODONE HCL 5 MG PO TABS
5.0000 mg | ORAL_TABLET | Freq: Once | ORAL | Status: AC
  Administered 2020-09-20: 5 mg via ORAL
  Filled 2020-09-20: qty 1

## 2020-09-20 MED ORDER — ACETAMINOPHEN 500 MG PO TABS
1000.0000 mg | ORAL_TABLET | Freq: Once | ORAL | Status: AC
  Administered 2020-09-20: 1000 mg via ORAL
  Filled 2020-09-20: qty 2

## 2020-09-20 MED ORDER — DICLOFENAC SODIUM 1 % EX GEL
4.0000 g | Freq: Four times a day (QID) | CUTANEOUS | 0 refills | Status: AC
  Filled 2020-09-20: qty 100, 7d supply, fill #0

## 2020-09-20 MED ORDER — IOHEXOL 300 MG/ML  SOLN
100.0000 mL | Freq: Once | INTRAMUSCULAR | Status: AC | PRN
  Administered 2020-09-20: 100 mL via INTRAVENOUS
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
## 2020-09-20 NOTE — Discharge Instructions (Addendum)
  take tylenol 1000mg (2 extra strength) four times a day. Use the gel as prescribed.  Follow up with your family doc.
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
## 2020-12-07 DIAGNOSIS — M25511 Pain in right shoulder: Secondary | ICD-10-CM | POA: Diagnosis not present

## 2021-01-18 DIAGNOSIS — M19011 Primary osteoarthritis, right shoulder: Secondary | ICD-10-CM | POA: Diagnosis not present

## 2021-01-18 DIAGNOSIS — M25511 Pain in right shoulder: Secondary | ICD-10-CM | POA: Diagnosis not present

## 2021-02-27 NOTE — Patient Instructions (Signed)
DUE TO COVID-19 ONLY ONE VISITOR IS ALLOWED TO COME WITH YOU AND STAY IN THE WAITING ROOM ONLY DURING PRE OP AND PROCEDURE.   **NO VISITORS ARE ALLOWED IN THE SHORT STAY AREA OR RECOVERY ROOM!!**  IF YOU WILL BE ADMITTED INTO THE HOSPITAL YOU ARE ALLOWED ONLY TWO SUPPORT PEOPLE DURING VISITATION HOURS ONLY (10AM -8PM)   The support person(s) may change daily. The support person(s) must pass our screening, gel in and out, and wear a mask at all times, including in the patient's room. Patients must also wear a mask when staff or their support person are in the room.  No visitors under the age of 52. Any visitor under the age of 56 must be accompanied by an adult.     Your procedure is scheduled on: 03/02/21   Report to [facility] Main Entrance   Report to Short Stay at 5:15 AM   [facility])   Call this number if you have problems the morning of surgery [phone]   Do not eat food :After Midnight.   May have liquids until : 4:30 AM   day of surgery  CLEAR LIQUID DIET  Foods Allowed                                                                     Foods Excluded  Water, Black Coffee and tea, regular and decaf                             liquids that you cannot  Plain Jell-O in any flavor  (No red)                                           see through such as: Fruit ices (not with fruit pulp)                                     milk, soups, orange juice              Iced Popsicles (No red)                                    All solid food                                   Apple juices Sports drinks like Gatorade (No red) Lightly seasoned clear broth or consume(fat free) Sugar,   Sample Menu Breakfast                                Lunch                                     Supper Cranberry juice  Beef broth                            Chicken broth Jell-O                                     Grape juice                           Apple juice Coffee  or tea                        Jell-O                                      Popsicle                                                Coffee or tea                        Coffee or tea     Complete one Ensure drink the morning of surgery at: 4:30 AM     the day of surgery.    The day of surgery:  Drink ONE (1) Pre-Surgery Clear Ensure or G2 by am the morning of surgery. Drink in one sitting. Do not sip.  This drink was given to you during your hospital  pre-op appointment visit. Nothing else to drink after completing the  Pre-Surgery Clear Ensure or G2.          If you have questions, please contact your surgeon's office.     Oral Hygiene is also important to reduce your risk of infection.                                    Remember - BRUSH YOUR TEETH THE MORNING OF SURGERY WITH YOUR REGULAR TOOTHPASTE   Do NOT smoke after Midnight   Take these medicines the morning of surgery with A SIP OF WATER: synthroid,pantoprazole.  DO NOT TAKE ANY ORAL DIABETIC MEDICATIONS DAY OF YOUR SURGERY                              You may not have any metal on your body including hair pins, jewelry, and body piercing             Do not wear make-up, lotions, powders, perfumes/cologne, or deodorant  Do not wear nail polish including gel and S&S, artificial/acrylic nails, or any other type of covering on natural nails including finger and toenails. If you have artificial nails, gel coating, etc. that needs to be removed by a nail salon please have this removed prior to surgery or surgery may need to be canceled/ delayed if the surgeon/ anesthesia feels like they are unable to be safely monitored.   Do not shave  48 hours prior to surgery.    Do not bring valuables to the hospital. [facility] IS NOT  RESPONSIBLE   FOR VALUABLES.   Contacts, dentures or bridgework may not be worn into surgery.   Bring small overnight bag day of surgery.    Patients discharged on the day of surgery will not be  allowed to drive home.   Special Instructions: Bring a copy of your healthcare power of attorney and living will documents         the day of surgery if you haven't scanned them before.              Please read over the following fact sheets you were given: IF YOU HAVE QUESTIONS ABOUT YOUR PRE-OP INSTRUCTIONS PLEASE CALL [phone]   [facility] - Preparing for Surgery Before surgery, you can play an important role.  Because skin is not sterile, your skin needs to be as free of germs as possible.  You can reduce the number of germs on your skin by washing with CHG (chlorahexidine gluconate) soap before surgery.  CHG is an antiseptic cleaner which kills germs and bonds with the skin to continue killing germs even after washing. Please DO NOT use if you have an allergy to CHG or antibacterial soaps.  If your skin becomes reddened/irritated stop using the CHG and inform your nurse when you arrive at Short Stay. Do not shave (including legs and underarms) for at least 48 hours prior to the first CHG shower.  You may shave your face/neck. Please follow these instructions carefully:  1.  Shower with CHG Soap the night before surgery and the  morning of Surgery.  2.  If you choose to wash your hair, wash your hair first as usual with your  normal  shampoo.  3.  After you shampoo, rinse your hair and body thoroughly to remove the  shampoo.                           4.  Use CHG as you would any other liquid soap.  You can apply chg directly  to the skin and wash                       Gently with a scrungie or clean washcloth.  5.  Apply the CHG Soap to your body ONLY FROM THE NECK DOWN.   Do not use on face/ open                           Wound or open sores. Avoid contact with eyes, ears mouth and genitals (private parts).                       Wash face,  Genitals (private parts) with your normal soap.             6.  Wash thoroughly, paying special attention to the area where your surgery  will be  performed.  7.  Thoroughly rinse your body with warm water from the neck down.  8.  DO NOT shower/wash with your normal soap after using and rinsing off  the CHG Soap.                9.  Pat yourself dry with a clean towel.            10.  Wear clean pajamas.            11.  Place clean sheets on your  bed the night of your first shower and do not  sleep with pets. Day of Surgery : Do not apply any lotions/deodorants the morning of surgery.  Please wear clean clothes to the hospital/surgery center.  FAILURE TO FOLLOW THESE INSTRUCTIONS MAY RESULT IN THE CANCELLATION OF YOUR SURGERY PATIENT SIGNATURE_________________________________  NURSE SIGNATURE__________________________________  ________________________________________________________________________ [facility]- Preparing for Total Shoulder Arthroplasty    Before surgery, you can play an important role. Because skin is not sterile, your skin needs to be as free of germs as possible. You can reduce the number of germs on your skin by using the following products. Benzoyl Peroxide Gel Reduces the number of germs present on the skin Applied twice a day to shoulder area starting two days before surgery    ==================================================================  Please follow these instructions carefully:  BENZOYL PEROXIDE 5% GEL  Please do not use if you have an allergy to benzoyl peroxide.   If your skin becomes reddened/irritated stop using the benzoyl peroxide.  Starting two days before surgery, apply as follows: Apply benzoyl peroxide in the morning and at night. Apply after taking a shower. If you are not taking a shower clean entire shoulder front, back, and side along with the armpit with a clean wet washcloth.  Place a quarter-sized dollop on your shoulder and rub in thoroughly, making sure to cover the front, back, and side of your shoulder, along with the armpit.   2 days before ____ AM   ____ PM              1  day before ____ AM   ____ PM                         Do this twice a day for two days.  (Last application is the night before surgery, AFTER using the CHG soap as described below).  Do NOT apply benzoyl peroxide gel on the day of surgery.   Incentive Spirometer  An incentive spirometer is a tool that can help keep your lungs clear and active. This tool measures how well you are filling your lungs with each breath. Taking long deep breaths may help reverse or decrease the chance of developing breathing (pulmonary) problems (especially infection) following: A long period of time when you are unable to move or be active. BEFORE THE PROCEDURE  If the spirometer includes an indicator to show your best effort, your nurse or respiratory therapist will set it to a desired goal. If possible, sit up straight or lean slightly forward. Try not to slouch. Hold the incentive spirometer in an upright position. INSTRUCTIONS FOR USE  Sit on the edge of your bed if possible, or sit up as far as you can in bed or on a chair. Hold the incentive spirometer in an upright position. Breathe out normally. Place the mouthpiece in your mouth and seal your lips tightly around it. Breathe in slowly and as deeply as possible, raising the piston or the ball toward the top of the column. Hold your breath for 3-5 seconds or for as long as possible. Allow the piston or ball to fall to the bottom of the column. Remove the mouthpiece from your mouth and breathe out normally. Rest for a few seconds and repeat Steps 1 through 7 at least 10 times every 1-2 hours when you are awake. Take your time and take a few normal breaths between deep breaths. The spirometer may include an indicator to show  your best effort. Use the indicator as a goal to work toward during each repetition. After each set of 10 deep breaths, practice coughing to be sure your lungs are clear. If you have an incision (the cut made at the time of surgery),  support your incision when coughing by placing a pillow or rolled up towels firmly against it. Once you are able to get out of bed, walk around indoors and cough well. You may stop using the incentive spirometer when instructed by your caregiver.  RISKS AND COMPLICATIONS Take your time so you do not get dizzy or light-headed. If you are in pain, you may need to take or ask for pain medication before doing incentive spirometry. It is harder to take a deep breath if you are having pain. AFTER USE Rest and breathe slowly and easily. It can be helpful to keep track of a log of your progress. Your caregiver can provide you with a simple table to help with this. If you are using the spirometer at home, follow these instructions: [place] IF:  You are having difficultly using the spirometer. You have trouble using the spirometer as often as instructed. Your pain medication is not giving enough relief while using the spirometer. You develop fever of 100.5 F (38.1 C) or higher. SEEK IMMEDIATE MEDICAL CARE IF:  You cough up bloody sputum that had not been present before. You develop fever of 102 F (38.9 C) or greater. You develop worsening pain at or near the incision site. MAKE SURE YOU:  Understand these instructions. Will watch your condition. Will get help right away if you are not doing well or get worse. Document Released: 09/10/2006 Document Revised: 07/23/2011 Document Reviewed: 11/11/2006 [facility] Patient Information 2014 [place], [place].   ________________________________________________________________________

## 2021-02-28 ENCOUNTER — Encounter (HOSPITAL_COMMUNITY)
Admission: RE | Admit: 2021-02-28 | Discharge: 2021-02-28 | Disposition: A | Discharge status: Home / Self Care | Payer: Medicare HMO | Source: Ambulatory Visit | Attending: Orthopedic Surgery | Admitting: Orthopedic Surgery

## 2021-02-28 ENCOUNTER — Encounter (HOSPITAL_COMMUNITY): Payer: Self-pay

## 2021-02-28 ENCOUNTER — Other Ambulatory Visit: Payer: Self-pay

## 2021-02-28 DIAGNOSIS — Z01818 Encounter for other preprocedural examination: Secondary | ICD-10-CM | POA: Diagnosis not present

## 2021-02-28 HISTORY — DX: Unspecified osteoarthritis, unspecified site: M19.90

## 2021-02-28 HISTORY — DX: Hypothyroidism, unspecified: E03.9

## 2021-02-28 LAB — CBC
HCT: 41.3 % (ref 36.0–46.0)
Hemoglobin: 13.6 g/dL (ref 12.0–15.0)
MCH: 30.5 pg (ref 26.0–34.0)
MCHC: 32.9 g/dL (ref 30.0–36.0)
MCV: 92.6 fL (ref 80.0–100.0)
Platelets: 181 10*3/uL (ref 150–400)
RBC: 4.46 MIL/uL (ref 3.87–5.11)
RDW: 13.2 % (ref 11.5–15.5)
WBC: 6.3 10*3/uL (ref 4.0–10.5)
nRBC: 0 % (ref 0.0–0.2)

## 2021-02-28 LAB — BASIC METABOLIC PANEL
Anion gap: 7 (ref 5–15)
BUN: 24 mg/dL — ABNORMAL HIGH (ref 8–23)
CO2: 26 mmol/L (ref 22–32)
Calcium: 8.8 mg/dL — ABNORMAL LOW (ref 8.9–10.3)
Chloride: 104 mmol/L (ref 98–111)
Creatinine, Ser: 0.88 mg/dL (ref 0.44–1.00)
GFR, Estimated: >60 mL/min (ref >60)
Glucose, Bld: 102 mg/dL — ABNORMAL HIGH (ref 70–99)
Potassium: 5 mmol/L (ref 3.5–5.1)
Sodium: 137 mmol/L (ref 135–145)

## 2021-02-28 LAB — SURGICAL PCR SCREEN
MRSA, PCR: NEGATIVE
Staphylococcus aureus: NEGATIVE

## 2021-02-28 NOTE — Progress Notes (Signed)
COVID Vaccine Completed: Yes Date COVID Vaccine completed: 02/04/21 x 5 COVID vaccine manufacturer: Moderna    COVID Test: N/A PCP - Dr. Jani Gravel Cardiologist -   Chest x-ray - 09/20/20 EKG -  Stress Test -  ECHO -  Cardiac Cath -  Pacemaker/ICD device last checked:  Sleep Study -  CPAP -   Fasting Blood Sugar -  Checks Blood Sugar _____ times a day  Blood Thinner Instructions: Aspirin Instructions: Last Dose:  Anesthesia review:   Patient denies shortness of breath, fever, cough and chest pain at PAT appointment   Patient verbalized understanding of instructions that were given to them at the PAT appointment. Patient was also instructed that they will need to review over the PAT instructions again at home before surgery.

## 2021-03-01 NOTE — Anesthesia Preprocedure Evaluation (Addendum)
Anesthesia Evaluation  Patient identified by MRN, date of birth, ID band Patient awake    Reviewed: Allergy & Precautions, NPO status , Patient's Chart, lab work & pertinent test results  Airway Mallampati: II  TM Distance: >3 FB Neck ROM: Full    Dental  (+) Missing, Chipped   Pulmonary neg pulmonary ROS, former smoker,    Pulmonary exam normal breath sounds clear to auscultation       Cardiovascular hypertension, Pt. on medications and Pt. on home beta blockers Normal cardiovascular exam Rhythm:Regular Rate:Normal     Neuro/Psych negative neurological ROS  negative psych ROS   GI/Hepatic negative GI ROS, Neg liver ROS,   Endo/Other  Hypothyroidism   Renal/GU negative Renal ROS  negative genitourinary   Musculoskeletal negative musculoskeletal ROS (+)   Abdominal   Peds negative pediatric ROS (+)  Hematology negative hematology ROS (+)   Anesthesia Other Findings   Reproductive/Obstetrics negative OB ROS                            Anesthesia Physical Anesthesia Plan  ASA: 2  Anesthesia Plan: General   Post-op Pain Management:  Regional for Post-op pain   Induction: Intravenous  PONV Risk Score and Plan: 3 and Ondansetron, Dexamethasone and Treatment may vary due to age or medical condition  Airway Management Planned: Oral ETT  Additional Equipment:   Intra-op Plan:   Post-operative Plan: Extubation in OR  Informed Consent: I have reviewed the patients History and Physical, chart, labs and discussed the procedure including the risks, benefits and alternatives for the proposed anesthesia with the patient or authorized representative who has indicated his/her understanding and acceptance.     Dental advisory given  Plan Discussed with: CRNA and Surgeon  Anesthesia Plan Comments:         Anesthesia Quick Evaluation

## 2021-03-02 ENCOUNTER — Encounter (HOSPITAL_COMMUNITY)
Admission: RE | Disposition: A | Discharge status: Home / Self Care | Payer: Self-pay | Source: Home / Self Care | Attending: Orthopedic Surgery

## 2021-03-02 ENCOUNTER — Encounter (HOSPITAL_COMMUNITY): Payer: Self-pay | Admitting: Orthopedic Surgery

## 2021-03-02 ENCOUNTER — Ambulatory Visit (HOSPITAL_COMMUNITY)
Admission: RE | Admit: 2021-03-02 | Discharge: 2021-03-02 | Disposition: A | Discharge status: Home / Self Care | Payer: Medicare HMO | Attending: Orthopedic Surgery | Admitting: Orthopedic Surgery

## 2021-03-02 ENCOUNTER — Ambulatory Visit (HOSPITAL_COMMUNITY): Payer: Medicare HMO | Admitting: Anesthesiology

## 2021-03-02 DIAGNOSIS — I10 Essential (primary) hypertension: Secondary | ICD-10-CM | POA: Insufficient documentation

## 2021-03-02 DIAGNOSIS — M75101 Unspecified rotator cuff tear or rupture of right shoulder, not specified as traumatic: Secondary | ICD-10-CM | POA: Diagnosis not present

## 2021-03-02 DIAGNOSIS — M12811 Other specific arthropathies, not elsewhere classified, right shoulder: Secondary | ICD-10-CM | POA: Diagnosis not present

## 2021-03-02 DIAGNOSIS — G8918 Other acute postprocedural pain: Secondary | ICD-10-CM | POA: Diagnosis not present

## 2021-03-02 DIAGNOSIS — E039 Hypothyroidism, unspecified: Secondary | ICD-10-CM | POA: Diagnosis not present

## 2021-03-02 DIAGNOSIS — Z87891 Personal history of nicotine dependence: Secondary | ICD-10-CM | POA: Insufficient documentation

## 2021-03-02 DIAGNOSIS — M1990 Unspecified osteoarthritis, unspecified site: Secondary | ICD-10-CM | POA: Diagnosis not present

## 2021-03-02 DIAGNOSIS — M19011 Primary osteoarthritis, right shoulder: Secondary | ICD-10-CM | POA: Diagnosis not present

## 2021-03-02 HISTORY — PX: REVERSE SHOULDER ARTHROPLASTY: SHX5054

## 2021-03-02 SURGERY — ARTHROPLASTY, SHOULDER, TOTAL, REVERSE
Anesthesia: General | Site: Shoulder | Laterality: Right

## 2021-03-02 MED ORDER — PHENYLEPHRINE 40 MCG/ML (10ML) SYRINGE FOR IV PUSH (FOR BLOOD PRESSURE SUPPORT)
PREFILLED_SYRINGE | INTRAVENOUS | Status: DC | PRN
  Administered 2021-03-02: 80 ug via INTRAVENOUS

## 2021-03-02 MED ORDER — VANCOMYCIN HCL 1000 MG IV SOLR
INTRAVENOUS | Status: DC | PRN
  Administered 2021-03-02: 1000 mg via TOPICAL

## 2021-03-02 MED ORDER — ONDANSETRON HCL 4 MG/2ML IJ SOLN: 4.0000 mg | Freq: Once | INTRAMUSCULAR | Status: DC | PRN

## 2021-03-02 MED ORDER — SUGAMMADEX SODIUM 200 MG/2ML IV SOLN
INTRAVENOUS | Status: DC | PRN
  Administered 2021-03-02: 200 mg via INTRAVENOUS

## 2021-03-02 MED ORDER — CYCLOBENZAPRINE HCL 10 MG PO TABS: 10.0000 mg | ORAL_TABLET | Freq: Three times a day (TID) | ORAL | 1 refills | Status: AC | PRN

## 2021-03-02 MED ORDER — ONDANSETRON HCL 4 MG/2ML IJ SOLN
INTRAMUSCULAR | Status: DC | PRN
  Administered 2021-03-02: 4 mg via INTRAVENOUS

## 2021-03-02 MED ORDER — EPHEDRINE SULFATE-NACL 50-0.9 MG/10ML-% IV SOSY
PREFILLED_SYRINGE | INTRAVENOUS | Status: DC | PRN
  Administered 2021-03-02: 5 mg via INTRAVENOUS

## 2021-03-02 MED ORDER — LACTATED RINGERS IV BOLUS
500.0000 mL | Freq: Once | INTRAVENOUS | Status: AC
  Administered 2021-03-02: 500 mL via INTRAVENOUS

## 2021-03-02 MED ORDER — LIDOCAINE 2% (20 MG/ML) 5 ML SYRINGE
INTRAMUSCULAR | Status: DC | PRN
  Administered 2021-03-02: 8 mg via INTRAVENOUS

## 2021-03-02 MED ORDER — PROPOFOL 10 MG/ML IV BOLUS
INTRAVENOUS | Status: AC
  Filled 2021-03-02: qty 20

## 2021-03-02 MED ORDER — 0.9 % SODIUM CHLORIDE (POUR BTL) OPTIME
TOPICAL | Status: DC | PRN
  Administered 2021-03-02: 1000 mL

## 2021-03-02 MED ORDER — FENTANYL CITRATE PF 50 MCG/ML IJ SOSY: 25.0000 ug | PREFILLED_SYRINGE | INTRAMUSCULAR | Status: DC | PRN

## 2021-03-02 MED ORDER — ORAL CARE MOUTH RINSE: 15.0000 mL | Freq: Once | OROMUCOSAL | Status: AC

## 2021-03-02 MED ORDER — BUPIVACAINE HCL (PF) 0.5 % IJ SOLN
INTRAMUSCULAR | Status: DC | PRN
  Administered 2021-03-02: 15 mL via PERINEURAL

## 2021-03-02 MED ORDER — CEFAZOLIN SODIUM-DEXTROSE 2-4 GM/100ML-% IV SOLN
2.0000 g | INTRAVENOUS | Status: AC
  Administered 2021-03-02: 2 g via INTRAVENOUS
  Filled 2021-03-02: qty 100

## 2021-03-02 MED ORDER — ETODOLAC 400 MG PO TABS: 400.0000 mg | ORAL_TABLET | Freq: Two times a day (BID) | ORAL | 1 refills | Status: AC

## 2021-03-02 MED ORDER — ROCURONIUM BROMIDE 10 MG/ML (PF) SYRINGE
PREFILLED_SYRINGE | INTRAVENOUS | Status: DC | PRN
  Administered 2021-03-02: 60 mg via INTRAVENOUS

## 2021-03-02 MED ORDER — CHLORHEXIDINE GLUCONATE 0.12 % MT SOLN
15.0000 mL | Freq: Once | OROMUCOSAL | Status: AC
  Administered 2021-03-02: 15 mL via OROMUCOSAL

## 2021-03-02 MED ORDER — BUPIVACAINE LIPOSOME 1.3 % IJ SUSP
INTRAMUSCULAR | Status: DC | PRN
  Administered 2021-03-02: 10 mL

## 2021-03-02 MED ORDER — LIDOCAINE HCL (PF) 2 % IJ SOLN
INTRAMUSCULAR | Status: AC
  Filled 2021-03-02: qty 5

## 2021-03-02 MED ORDER — OXYCODONE-ACETAMINOPHEN 5-325 MG PO TABS: 1.0000 | ORAL_TABLET | ORAL | 0 refills | Status: AC | PRN

## 2021-03-02 MED ORDER — STERILE WATER FOR IRRIGATION IR SOLN
Status: DC | PRN
  Administered 2021-03-02: 2000 mL

## 2021-03-02 MED ORDER — OXYCODONE HCL 5 MG PO TABS: 5.0000 mg | ORAL_TABLET | Freq: Once | ORAL | Status: DC | PRN

## 2021-03-02 MED ORDER — ONDANSETRON HCL 4 MG PO TABS: 4.0000 mg | ORAL_TABLET | Freq: Three times a day (TID) | ORAL | 0 refills | Status: AC | PRN

## 2021-03-02 MED ORDER — FENTANYL CITRATE (PF) 250 MCG/5ML IJ SOLN
INTRAMUSCULAR | Status: DC | PRN
  Administered 2021-03-02 (×2): 50 ug via INTRAVENOUS

## 2021-03-02 MED ORDER — TRANEXAMIC ACID 1000 MG/10ML IV SOLN: 1000.0000 mg | INTRAVENOUS | Status: DC

## 2021-03-02 MED ORDER — MIDAZOLAM HCL 2 MG/2ML IJ SOLN
INTRAMUSCULAR | Status: AC
  Filled 2021-03-02: qty 2

## 2021-03-02 MED ORDER — LACTATED RINGERS IV BOLUS
250.0000 mL | Freq: Once | INTRAVENOUS | Status: AC
  Administered 2021-03-02: 250 mL via INTRAVENOUS

## 2021-03-02 MED ORDER — PROPOFOL 10 MG/ML IV BOLUS
INTRAVENOUS | Status: DC | PRN
  Administered 2021-03-02: 140 mg via INTRAVENOUS

## 2021-03-02 MED ORDER — VANCOMYCIN HCL 1000 MG IV SOLR
INTRAVENOUS | Status: AC
  Filled 2021-03-02: qty 20

## 2021-03-02 MED ORDER — LACTATED RINGERS IV SOLN: INTRAVENOUS | Status: DC

## 2021-03-02 MED ORDER — MIDAZOLAM HCL 2 MG/2ML IJ SOLN
INTRAMUSCULAR | Status: DC | PRN
  Administered 2021-03-02: 2 mg via INTRAVENOUS

## 2021-03-02 MED ORDER — FENTANYL CITRATE (PF) 100 MCG/2ML IJ SOLN
INTRAMUSCULAR | Status: AC
  Filled 2021-03-02: qty 2

## 2021-03-02 MED ORDER — ROCURONIUM BROMIDE 10 MG/ML (PF) SYRINGE
PREFILLED_SYRINGE | INTRAVENOUS | Status: AC
  Filled 2021-03-02: qty 10

## 2021-03-02 MED ORDER — OXYCODONE HCL 5 MG/5ML PO SOLN
5.0000 mg | Freq: Once | ORAL | Status: DC | PRN
Start: 2021-03-02 — End: 2021-03-02

## 2021-03-02 MED ORDER — ACETAMINOPHEN 10 MG/ML IV SOLN: 1000.0000 mg | Freq: Once | INTRAVENOUS | Status: DC | PRN

## 2021-03-02 MED ORDER — TRANEXAMIC ACID-NACL 1000-0.7 MG/100ML-% IV SOLN
1000.0000 mg | INTRAVENOUS | Status: AC
  Administered 2021-03-02: 1000 mg via INTRAVENOUS
  Filled 2021-03-02: qty 100

## 2021-03-02 SURGICAL SUPPLY — 70 items
ADH SKN CLS APL DERMABOND .7 (GAUZE/BANDAGES/DRESSINGS) ×1
AID PSTN UNV HD RSTRNT DISP (MISCELLANEOUS) ×1
BAG COUNTER SPONGE SURGICOUNT (BAG) IMPLANT
BAG SPEC THK2 15X12 ZIP CLS (MISCELLANEOUS) ×1
BAG SPNG CNTER NS LX DISP (BAG)
BAG ZIPLOCK 12X15 (MISCELLANEOUS) ×2 IMPLANT
BLADE SAW SGTL 83.5X18.5 (BLADE) ×2 IMPLANT
BNDG COHESIVE 4X5 TAN ST LF (GAUZE/BANDAGES/DRESSINGS) ×2 IMPLANT
BSPLAT GLND +2X24 MDLR (Joint) ×1 IMPLANT
COOLER ICEMAN CLASSIC (MISCELLANEOUS) ×2 IMPLANT
COVER BACK TABLE 60X90IN (DRAPES) ×2 IMPLANT
COVER SURGICAL LIGHT HANDLE (MISCELLANEOUS) ×2 IMPLANT
CUP SUT UNIV REVERS 36 NEUTRAL (Cup) ×1 IMPLANT
DERMABOND ADVANCED (GAUZE/BANDAGES/DRESSINGS) ×1
DERMABOND ADVANCED .7 DNX12 (GAUZE/BANDAGES/DRESSINGS) ×1 IMPLANT
DRAPE INCISE IOBAN 66X45 STRL (DRAPES) IMPLANT
DRAPE ORTHO SPLIT 77X108 STRL (DRAPES) ×4
DRAPE SHEET LG 3/4 BI-LAMINATE (DRAPES) ×2 IMPLANT
DRAPE SURG 17X11 SM STRL (DRAPES) ×2 IMPLANT
DRAPE SURG ORHT 6 SPLT 77X108 (DRAPES) ×2 IMPLANT
DRAPE TOP 10253 STERILE (DRAPES) ×2 IMPLANT
DRAPE U-SHAPE 47X51 STRL (DRAPES) ×2 IMPLANT
DRESSING AQUACEL AG SP 3.5X6 (GAUZE/BANDAGES/DRESSINGS) ×1 IMPLANT
DRSG AQUACEL AG ADV 3.5X10 (GAUZE/BANDAGES/DRESSINGS) IMPLANT
DRSG AQUACEL AG SP 3.5X6 (GAUZE/BANDAGES/DRESSINGS) ×2
DURAPREP 26ML APPLICATOR (WOUND CARE) ×2 IMPLANT
ELECT BLADE TIP CTD 4 INCH (ELECTRODE) ×2 IMPLANT
ELECT REM PT RETURN 15FT ADLT (MISCELLANEOUS) ×2 IMPLANT
FACESHIELD WRAPAROUND (MASK) ×8 IMPLANT
FACESHIELD WRAPAROUND OR TEAM (MASK) ×4 IMPLANT
GLENOID UNI REV MOD 24 +2 LAT (Joint) ×1 IMPLANT
GLENOSPHERE 36 +4 LAT/24 (Joint) ×1 IMPLANT
GLOVE SRG 8 PF TXTR STRL LF DI (GLOVE) ×1 IMPLANT
GLOVE SURG ENC MOIS LTX SZ7 (GLOVE) ×2 IMPLANT
GLOVE SURG ENC MOIS LTX SZ7.5 (GLOVE) ×2 IMPLANT
GLOVE SURG UNDER POLY LF SZ7 (GLOVE) ×2 IMPLANT
GLOVE SURG UNDER POLY LF SZ8 (GLOVE) ×2
GOWN STRL REUS W/TWL LRG LVL3 (GOWN DISPOSABLE) ×4 IMPLANT
INSERT HUMERAL 36 +6 (Shoulder) ×1 IMPLANT
KIT BASIN OR (CUSTOM PROCEDURE TRAY) ×2 IMPLANT
MANIFOLD NEPTUNE II (INSTRUMENTS) ×2 IMPLANT
NDL TAPERED W/ NITINOL LOOP (MISCELLANEOUS) ×1 IMPLANT
NEEDLE TAPERED W/ NITINOL LOOP (MISCELLANEOUS) ×2 IMPLANT
NS IRRIG 1000ML POUR BTL (IV SOLUTION) ×2 IMPLANT
PACK SHOULDER (CUSTOM PROCEDURE TRAY) ×2 IMPLANT
PAD ARMBOARD 7.5X6 YLW CONV (MISCELLANEOUS) ×2 IMPLANT
PAD COLD SHLDR WRAP-ON (PAD) ×2 IMPLANT
PIN NITINOL TARGETER 2.8 (PIN) IMPLANT
PIN SET MODULAR GLENOID SYSTEM (PIN) ×1 IMPLANT
RESTRAINT HEAD UNIVERSAL NS (MISCELLANEOUS) ×2 IMPLANT
SCREW CENTRAL MODULAR 25 (Screw) ×1 IMPLANT
SCREW PERI LOCK 5.5X16 (Screw) ×2 IMPLANT
SCREW PERIPHERAL 5.5X20 LOCK (Screw) ×2 IMPLANT
SLING ARM FOAM STRAP LRG (SOFTGOODS) ×2 IMPLANT
SLING ARM FOAM STRAP MED (SOFTGOODS) IMPLANT
SPONGE T-LAP 18X18 ~~LOC~~+RFID (SPONGE) IMPLANT
STEM HUMERAL UNIVER REV SIZE 7 (Stem) ×1 IMPLANT
SUCTION FRAZIER HANDLE 12FR (TUBING) ×2
SUCTION TUBE FRAZIER 12FR DISP (TUBING) ×1 IMPLANT
SUT FIBERWIRE #2 38 T-5 BLUE (SUTURE)
SUT MNCRL AB 3-0 PS2 18 (SUTURE) ×2 IMPLANT
SUT MON AB 2-0 CT1 36 (SUTURE) ×2 IMPLANT
SUT VIC AB 1 CT1 36 (SUTURE) ×2 IMPLANT
SUTURE FIBERWR #2 38 T-5 BLUE (SUTURE) IMPLANT
SUTURE TAPE 1.3 40 TPR END (SUTURE) ×2 IMPLANT
SUTURETAPE 1.3 40 TPR END (SUTURE) ×4
TOWEL OR 17X26 10 PK STRL BLUE (TOWEL DISPOSABLE) ×2 IMPLANT
TOWEL OR NON WOVEN STRL DISP B (DISPOSABLE) ×2 IMPLANT
WATER STERILE IRR 1000ML POUR (IV SOLUTION) ×4 IMPLANT
YANKAUER SUCT BULB TIP 10FT TU (MISCELLANEOUS) ×2 IMPLANT

## 2021-03-02 NOTE — Anesthesia Postprocedure Evaluation (Signed)
Anesthesia Post Note  Patient: EMERA BUSSIE  Procedure(s) Performed: REVERSE SHOULDER ARTHROPLASTY (Right: Shoulder)     Patient location during evaluation: PACU Anesthesia Type: General Level of consciousness: awake and alert Pain management: pain level controlled Vital Signs Assessment: post-procedure vital signs reviewed and stable Respiratory status: spontaneous breathing, nonlabored ventilation, respiratory function stable and patient connected to nasal cannula oxygen Cardiovascular status: blood pressure returned to baseline and stable Postop Assessment: no apparent nausea or vomiting Anesthetic complications: no   No notable events documented.  Last Vitals:  Vitals:   03/02/21 0949 03/02/21 1000  BP: (!) 117/49 (!) 122/55  Pulse: (!) 58 (!) 55  Resp: 16   Temp:    SpO2: 98% 96%    Last Pain:  Vitals:   03/02/21 0949  TempSrc:   PainSc: 0-No pain                 Santita Hunsberger S

## 2021-03-02 NOTE — Anesthesia Procedure Notes (Signed)
Anesthesia Procedure Image    

## 2021-03-02 NOTE — H&P (Signed)
Nancy Munoz    Chief Complaint: Right Shoulder rotator cuff tear arthropathy HPI: The patient is a 70 y.o. female with chronic and progressively increasing right shoulder pain related to severe rotator cuff tear arthropathy.  Due to her increasing functional limitations and failure to respond to prolonged attempts at conservative management, she is brought to the operating room at this time for planned right shoulder reverse arthroplasty  Past Medical History:  Diagnosis Date   Arthritis    Hypertension    Hypothyroidism    Kidney stone     Past Surgical History:  Procedure Laterality Date   APPENDECTOMY     arm fracture Left    CHOLECYSTECTOMY     KIDNEY STONE SURGERY     TONSILLECTOMY     TUBAL LIGATION      Family History  Problem Relation Age of Onset   Breast cancer Neg Hx     Social History:  reports that she quit smoking about 22 years ago. Her smoking use included cigarettes. She has never used smokeless tobacco. She reports current alcohol use. She reports that she does not currently use drugs.   Medications Prior to Admission  Medication Sig Dispense Refill   acetaminophen (TYLENOL) 500 MG tablet Take 1,000 mg by mouth every 6 (six) hours as needed for mild pain or moderate pain.     bisoprolol-hydrochlorothiazide (ZIAC) 10-6.25 MG tablet Take 1 tablet by mouth daily.     diclofenac Sodium (VOLTAREN) 1 % GEL Apply 4 grams on to the skin 4 (four) times daily. (Patient taking differently: Apply 2 g topically daily as needed (pain).) 100 g 0   etodolac (LODINE) 400 MG tablet Take 400 mg by mouth every 8 (eight) hours as needed (inflammation).     levothyroxine (SYNTHROID) 50 MCG tablet Take 50 mcg by mouth daily.     lisinopril (PRINIVIL,ZESTRIL) 20 MG tablet Take 20 mg by mouth daily.     pantoprazole (PROTONIX) 40 MG tablet Take 40 mg by mouth daily as needed (Acid Reflux).       Physical Exam: Right shoulder demonstrates painful and guarded motion as noted at  her recent office visits.  She has globally decreased strength to manual muscle testing.  Plain radiographs as well as MRI scan confirm changes consistent with a chronic rotator cuff tear arthropathy  Vitals  Temp:  [97.8 F (36.6 C)] 97.8 F (36.6 C) (10/20 8921) Pulse Rate:  [70] 70 (10/20 0613) Resp:  [13] 13 (10/20 0613) BP: (145)/(61) 145/61 (10/20 0613) SpO2:  [97 %] 97 % (10/20 1941)  Assessment/Plan  Impression: Right Shoulder rotator cuff tear arthropathy  Plan of Action: Procedure(s): REVERSE SHOULDER ARTHROPLASTY  Barlow Harrison M Tanara Turvey 03/02/2021, 6:28 AM Contact # [phone]

## 2021-03-02 NOTE — Anesthesia Procedure Notes (Signed)
Procedure Name: Intubation Date/Time: 03/02/2021 7:40 AM Performed by: [place] Ronte Parker C, CRNA Pre-anesthesia Checklist: Patient identified, Emergency Drugs available, Suction available and Patient being monitored Patient Re-evaluated:Patient Re-evaluated prior to induction Oxygen Delivery Method: Circle system utilized Preoxygenation: Pre-oxygenation with 100% oxygen Induction Type: IV induction Ventilation: Mask ventilation without difficulty Laryngoscope Size: Mac and 3 Grade View: Grade II Tube type: Oral Tube size: 7.0 mm Number of attempts: 1 Airway Equipment and Method: Stylet and Oral airway Placement Confirmation: ETT inserted through vocal cords under direct vision, positive ETCO2 and breath sounds checked- equal and bilateral Secured at: 21 cm Tube secured with: Tape Dental Injury: Teeth and Oropharynx as per pre-operative assessment

## 2021-03-02 NOTE — Op Note (Signed)
03/02/2021  9:03 AM  PATIENT:   Nancy Munoz  70 y.o. female  PRE-OPERATIVE DIAGNOSIS:  Right Shoulder rotator cuff tear arthropathy  POST-OPERATIVE DIAGNOSIS: Same  PROCEDURE: Right shoulder reverse arthroplasty utilizing a press-fit size 7 Arthrex stem with a neutral metaphysis, +6 polyethylene insert, 36/+4 glenosphere on a small/+2 baseplate  SURGEON:  Jaala Bohle, Metta Clines M.D.  ASSISTANTS: Jenetta Loges, PA-C  ANESTHESIA:   General endotracheal and interscalene block with Exparel  EBL: 150 cc  SPECIMEN: None  Drains: None   PATIENT DISPOSITION:  PACU - hemodynamically stable.    PLAN OF CARE: Discharge to home after PACU  Brief history:  Nancy Munoz is a 70 year old female who has been followed for chronic and progressively increasing right shoulder pain related to severe rotator cuff tear arthropathy.  Due to her increasing functional rotations and failure to respond to prolonged attempts at conservative management, she is brought to the operating room at this time for planned right shoulder reverse arthroplasty.  Preoperatively, I counseled the patient regarding treatment options and risks versus benefits thereof.  Possible surgical complications were all reviewed including potential for bleeding, infection, neurovascular injury, persistent pain, loss of motion, anesthetic complication, failure of the implant, and possible need for additional surgery. They understand and accept and agrees with our planned procedure.   Procedure in detail:  After undergoing routine preop evaluation the patient received prophylactic antibiotics and interscalene block with Exparel was established in the holding area by the anesthesia department.  Subsequently placed supine on the operating table and underwent the smooth induction of the general endotracheal anesthesia.  Placed into the beachchair position and appropriately padded and protected.  The right shoulder girdle region was sterilely  prepped and draped in standard fashion.  Timeout was called.  A deltopectoral approach to the right shoulder was made in approximately a centimeter incision.  Skin flaps elevated dissection carried deeply the deltopectoral interval was developed from proximal to distal with the vein taken laterally.  Upper centimeter the pectoralis major was tenotomized for exposure the conjoined tendon mobilized and retracted medially.  The long head bicep tendon was tenodesed at the upper border the pectoralis major tendon with proximal segment unroofed and excised and it did appear as though it had actually scarified in the bicipital groove with the more proximal segment within the joint absent.  The subscapularis was then separated from the lesser tuberosity using electrocautery and the margin was then tagged with a pair of suture tape sutures.  Capsular attachments were then divided from the anterior and infra margins of the humeral neck and humeral head was delivered through the wound.  An extra medullary guide was then used to outline a proposed humeral head resection which was performed with an oscillating saw at approximately 20 degrees retroversion and a rondure was then used to remove the marginal osteophytes.  A metal cap placed over the cut proximal humeral surface and the glenoid was then exposed with appropriate retractors.  A circumferential labral resection was then completed.  Guidepin then directed into the center of the glenoid with an approximately 5 degree inferior tilt and the glenoid was then reamed with the central peripheral reamers and completed with the central drill and tapped at a 25 mm lag screw.  All debris was then carefully removed.  Our baseplate was assembled with vancomycin powder applied to the threads of the lag screw the baseplate was then inserted achieving excellent fit and fixation.  All the peripheral locking screws then placed  using standard technique again with excellent fixation.  A  36/+4 glenosphere was then impacted over the baseplate and the central locking screw was placed.  Attention returned to the proximal humerus where the canal was opened hand reaming to size 6 broaching up to size 7 at approximate 20 degrees retroversion with excellent fit.  A central reaming guide was then used to prepare the metaphysis.  A trial implant was placed and good motion stability and soft tissue balance was achieved.  The trial was then removed and our final implant was assembled.  Vancomycin powder was then spread liberally into the humeral canal and the final implant was then seated again with excellent fit and fixation.  A series of trial reductions was then performed and ultimately felt that the +6 probably give [place] the best motion stability and soft tissue balance.  Trial polyp was removed.  The implant was cleaned and dried and the final +6 polyp was then impacted.  Final reduction was then performed.  Again very pleased with overall motion stability and soft tissue balance.  The joint was then copiously irrigated.  Final hemostasis was obtained.  We confirmed appropriate elasticity of the subscapularis tendon was then repaired back to the eyelets on the collar of our implant using the previously placed suture tape sutures.  The balance of vancomycin powder was then spread liberally throughout the deep soft tissue layers.  The deltopectoral interval was reapproximated with a series of figure-of-eight number Vicryl sutures.  2-0 Monocryl used for subcu layer and intracuticular 3-0 Monocryl for the skin followed by Dermabond and Aquacel dressing.  The right arm was then placed into a sling and the patient was awakened, extubated, and taken to the recovery room in stable condition.  Jenetta Loges, PA-C was utilized as an Environmental consultant throughout this case, essential for help with positioning the patient, positioning extremity, tissue manipulation, implantation of the prosthesis, suture management, wound  closure, and intraoperative decision-making.  Marin Shutter MD   Contact # [phone]

## 2021-03-02 NOTE — Evaluation (Signed)
Occupational Therapy Evaluation Patient Details Name: Nancy Munoz MRN: [id number] DOB: 04-29-1951 Today's Date: 03/02/2021   History of Present Illness 70 y.o. female s/p R shoulder reverse arthroplasty 10/20. PMH of arthritis, HTN, breast ca, arthritis.   Clinical Impression   PTA, pt was living at home alone, working as a Health visitor at a medical facility, and independent in ADLs/IADLs. Pt s/p reverse R shoulder replacement without functional use of right dominant upper extremity secondary to effects of surgery and interscalene block and shoulder precautions. Therapist provided education and instruction to patient and daughter in regards to exercises, precautions, positioning, donning upper extremity clothing and bathing while maintaining shoulder precautions, ice and edema management and donning/doffing sling. Patient and daughter verbalized understanding and demonstrated as needed. Patient needed assistance to donn shirt, pants, and shoes and provided with instruction on compensatory strategies to perform ADLs. Patient to follow up with MD for further therapy needs.        Recommendations for follow up therapy are one component of a multi-disciplinary discharge planning process, led by the attending physician.  Recommendations may be updated based on patient status, additional functional criteria and insurance authorization.   Follow Up Recommendations  No OT follow up    Equipment Recommendations  None recommended by OT    Recommendations for Other Services       Precautions / Restrictions Precautions Precautions: Shoulder Shoulder Interventions: Shoulder sling/immobilizer;Off for dressing/bathing/exercises Precaution Booklet Issued: Yes (comment) Precaution Comments: Per MD orders-"If sitting in controlled environment, ok to come out of sling to give neck a break. Please sleep in it to protect until follow up in office.  OK to use operative arm for feeding, hygiene and  ADLs.   Ok to instruct Pendulums and lap slides as exercises. Ok to use operative arm within the following parameters for ADL purposes     New ROM (8/18)   Ok for PROM, AAROM, AROM within pain tolerance and within the following ROM   ER 20   ABD 45   FE 60" Required Braces or Orthoses: Sling Restrictions Weight Bearing Restrictions: Yes RUE Weight Bearing: Non weight bearing      Mobility Bed Mobility                    Transfers Overall transfer level: Needs assistance   Transfers: Sit to/from Stand Sit to Stand: Min guard         General transfer comment: Unsteady on feet    Balance Overall balance assessment: No apparent balance deficits (not formally assessed) (unsteady on feet, stood without UE support)                                         ADL either performed or assessed with clinical judgement   ADL Overall ADL's : Needs assistance/impaired Eating/Feeding: Sitting;Set up   Grooming: Sitting;Minimal assistance   Upper Body Bathing: Standing;Minimal assistance   Lower Body Bathing: Minimal assistance;Sit to/from stand   Upper Body Dressing : Sitting;Maximal assistance Upper Body Dressing Details (indicate cue type and reason): Daughter assisted with dressing, difficult to assess how much pt could do on own. Able to thread RUE through sleeve and assist to hold strap while daughter applied sling Lower Body Dressing: Minimal assistance;Sit to/from stand Lower Body Dressing Details (indicate cue type and reason): assist to donn legs in pants from daughter Toilet Transfer: Nature conservation officer  Toileting- Clothing Manipulation and Hygiene: Minimal assistance;Sit to/from stand       Functional mobility during ADLs: Min guard       Vision Baseline Vision/History: 1 Wears glasses Patient Visual Report: No change from baseline       Perception     Praxis      Pertinent Vitals/Pain Pain Assessment: No/denies pain     Hand  Dominance Right   Extremity/Trunk Assessment Upper Extremity Assessment Upper Extremity Assessment: RUE deficits/detail RUE Deficits / Details: pt s/p nerve block, with residual numbness.   Lower Extremity Assessment Lower Extremity Assessment: Overall WFL for tasks assessed   Cervical / Trunk Assessment Cervical / Trunk Assessment: Normal   Communication Communication Communication: No difficulties   Cognition Arousal/Alertness: Awake/alert Behavior During Therapy: WFL for tasks assessed/performed Overall Cognitive Status: Within Functional Limits for tasks assessed                                     General Comments       Exercises Exercises: Shoulder   Shoulder Instructions Shoulder Instructions Donning/doffing shirt without moving shoulder: Patient able to independently direct caregiver;Caregiver independent with task Method for sponge bathing under operated UE: Independent Donning/doffing sling/immobilizer: Caregiver independent with task;Patient able to independently direct caregiver Correct positioning of sling/immobilizer: Patient able to independently direct caregiver;Caregiver independent with task Pendulum exercises (written home exercise program): Caregiver independent with task;Patient able to independently direct caregiver ROM for elbow, wrist and digits of operated UE: Independent Sling wearing schedule (on at all times/off for ADL's): Independent Proper positioning of operated UE when showering: Independent;Patient able to independently direct caregiver Dressing change: Patient able to independently direct caregiver;Caregiver independent with task Positioning of UE while sleeping: Independent;Patient able to independently direct caregiver    Home Living Family/patient expects to be discharged to:: Private residence Living Arrangements: Children Available Help at Discharge: Family Type of Home: House Home Access: Stairs to enter Engineer, site of Steps: 3 Entrance Stairs-Rails: Left       Bathroom Shower/Tub: Walk-in shower             Additional Comments: above information is daughters home, pt to live with daughter post d/c.      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        OT Problem List: Decreased strength;Decreased range of motion;Decreased knowledge of precautions      OT Treatment/Interventions:      OT Goals(Current goals can be found in the care plan section)    OT Frequency:     Barriers to D/C:            Co-evaluation              AM-PAC OT "6 Clicks" Daily Activity     Outcome Measure Help from another person eating meals?: A Little Help from another person taking care of personal grooming?: A Little Help from another person toileting, which includes using toliet, bedpan, or urinal?: A Little Help from another person bathing (including washing, rinsing, drying)?: A Lot Help from another person to put on and taking off regular upper body clothing?: A Lot Help from another person to put on and taking off regular lower body clothing?: A Little 6 Click Score: 16   End of Session Equipment Utilized During Treatment:  Other (comment) (shoulder sling) Nurse Communication: Mobility status  Activity Tolerance: Patient tolerated treatment well Patient left: in chair;with call bell/phone within reach;with family/visitor present  OT Visit Diagnosis: Unsteadiness on feet (R26.81)                Time: 7902-4097 OT Time Calculation (min): 45 min Charges:  OT General Charges $OT Visit: 1 Visit OT Evaluation $OT Eval Low Complexity: 1 Low OT Treatments $Self Care/Home Management : 23-37 mins  Jackquline Denmark, OTS Acute Rehab Office: [phone]   Shekinah Pitones 03/02/2021, 11:50 AM

## 2021-03-02 NOTE — Transfer of Care (Signed)
Immediate Anesthesia Transfer of Care Note  Patient: Nancy Munoz  Procedure(s) Performed: REVERSE SHOULDER ARTHROPLASTY (Right: Shoulder)  Patient Location: PACU  Anesthesia Type:General and GA combined with regional for post-op pain  Level of Consciousness: awake, alert  and oriented  Airway & Oxygen Therapy: Patient Spontanous Breathing and Patient connected to face mask oxygen  Post-op Assessment: Report given to RN and Post -op Vital signs reviewed and stable  Post vital signs: Reviewed and stable  Last Vitals:  Vitals Value Taken Time  BP 126/51 03/02/21 0915  Temp    Pulse 58 03/02/21 0916  Resp 18 03/02/21 0916  SpO2 100 % 03/02/21 0916  Vitals shown include unvalidated device data.  Last Pain:  Vitals:   03/02/21 0633  TempSrc:   PainSc: 8          Complications: No notable events documented.

## 2021-03-02 NOTE — Anesthesia Procedure Notes (Signed)
Anesthesia Regional Block: Interscalene brachial plexus block   Pre-Anesthetic Checklist: , timeout performed,  Correct Patient, Correct Site, Correct Laterality,  Correct Procedure, Correct Position, site marked,  Risks and benefits discussed,  Surgical consent,  Pre-op evaluation,  At surgeon's request and post-op pain management  Laterality: Right  Prep: chloraprep       Needles:  Injection technique: Single-shot  Needle Type: Echogenic Stimulator Needle     Needle Length: 9cm      Additional Needles:   Procedures:,,,, ultrasound used (permanent image in chart),,     Nerve Stimulator or Paresthesia:  Response: 0.5 mA  Additional Responses:   Narrative:  Start time: 03/02/2021 6:58 AM End time: 03/02/2021 7:08 AM Injection made incrementally with aspirations every 5 mL.  Performed by: Personally  Anesthesiologist: Myrtie Soman, MD  Additional Notes: Patient tolerated the procedure well without complications

## 2021-03-02 NOTE — Discharge Instructions (Signed)
 Kevin M. Supple, M.D., F.A.A.O.S. Orthopaedic Surgery Specializing in Arthroscopic and Reconstructive Surgery of the Shoulder [phone] [address] - [place], [place] [address] - Fax [phone]   POST-OP TOTAL SHOULDER REPLACEMENT INSTRUCTIONS  1. Follow up in the office for your first post-op appointment 10-14 days from the date of your surgery. If you do not already have a scheduled appointment, our office will contact you to schedule.  2. The bandage over your incision is waterproof. You may begin showering with this dressing on. You may leave this dressing on until first follow up appointment within 2 weeks. We prefer you leave this dressing in place until follow up however after 5-7 days if you are having itching or skin irritation and would like to remove it you may do so. Go slow and tug at the borders gently to break the bond the dressing has with the skin. At this point if there is no drainage it is okay to go without a bandage or you may cover it with a light guaze and tape. You can also expect significant bruising around your shoulder that will drift down your arm and into your chest wall. This is very normal and should resolve over several days.   3. Wear your sling/immobilizer at all times except to perform the exercises below or to occasionally let your arm dangle by your side to stretch your elbow. You also need to sleep in your sling immobilizer until instructed otherwise. It is ok to remove your sling if you are sitting in a controlled environment and allow your arm to rest in a position of comfort by your side or on your lap with pillows to give your neck and skin a break from the sling. You may remove it to allow arm to dangle by side to shower. If you are up walking around and when you go to sleep at night you need to wear it.  4. Range of motion to your elbow, wrist, and hand are encouraged 3-5 times daily. Exercise to your hand and fingers helps to reduce  swelling you may experience.   5. Prescriptions for a pain medication and a muscle relaxant are provided for you. It is recommended that if you are experiencing pain that you pain medication alone is not controlling, add the muscle relaxant along with the pain medication which can give additional pain relief. The first 1-2 days is generally the most severe of your pain and then should gradually decrease. As your pain lessens it is recommended that you decrease your use of the pain medications to an "as needed basis'" only and to always comply with the recommended dosages of the pain medications.  6. Pain medications can produce constipation along with their use. If you experience this, the use of an over the counter stool softener or laxative daily is recommended.   7. For additional questions or concerns, please do not hesitate to call the office. If after hours there is an answering service to forward your concerns to the physician on call.  8.Pain control following an exparel block  To help control your post-operative pain you received a nerve block  performed with Exparel which is a long acting anesthetic (numbing agent) which can provide pain relief and sensations of numbness (and relief of pain) in the operative shoulder and arm for up to 3 days. Sometimes it provides mixed relief, meaning you may still have numbness in certain areas of the arm but can still be able to   move  parts of that arm, hand, and fingers. We recommend that your prescribed pain medications  be used as needed. We do not feel it is necessary to "pre medicate" and "stay ahead" of pain.  Taking narcotic pain medications when you are not having any pain can lead to unnecessary and potentially dangerous side effects.    9. Use the ice machine as much as possible in the first 5-7 days from surgery, then you can wean its use to as needed. The ice typically needs to be replaced every 6 hours, instead of ice you can actually freeze  water bottles to put in the cooler and then fill water around them to avoid having to purchase ice. You can have spare water bottles freezing to allow you to rotate them once they have melted. Try to have a thin shirt or light cloth or towel under the ice wrap to protect your skin.   FOR ADDITIONAL INFO ON ICE MACHINE AND INSTRUCTIONS GO TO THE WEBSITE AT  https://www.[employer].com/products/donjoy/donjoy-iceman-[email or web address]  10.  We recommend that you avoid any dental work or cleaning in the first 3 months following your joint replacement. This is to help minimize the possibility of infection from the bacteria in your mouth that enters your bloodstream during dental work. We also recommend that you take an antibiotic prior to your dental work for the first year after your shoulder replacement to further help reduce that risk. Please simply contact our office for antibiotics to be sent to your pharmacy prior to dental work.  11. Dental Antibiotics:  In most cases prophylactic antibiotics for Dental procdeures after total joint surgery are not necessary.  Exceptions are as follows:  1. History of prior total joint infection  2. Severely immunocompromised (Organ Transplant, cancer chemotherapy, Rheumatoid biologic meds such as Humera)  3. Poorly controlled diabetes (A1C &gt; 8.0, blood glucose over 200)  If you have one of these conditions, contact your surgeon for an antibiotic prescription, prior to your dental procedure.   POST-OP EXERCISES  Pendulum Exercises  Perform pendulum exercises while standing and bending at the waist. Support your uninvolved arm on a table or chair and allow your operated arm to hang freely. Make sure to do these exercises passively - not using you shoulder muscles. These exercises can be performed once your nerve block effects have worn off.  Repeat 20 times. Do 3 sessions per day.     

## 2021-03-08 ENCOUNTER — Encounter (HOSPITAL_COMMUNITY): Payer: Self-pay | Admitting: Orthopedic Surgery

## 2021-03-14 DIAGNOSIS — E039 Hypothyroidism, unspecified: Secondary | ICD-10-CM | POA: Diagnosis not present

## 2021-03-14 DIAGNOSIS — E559 Vitamin D deficiency, unspecified: Secondary | ICD-10-CM | POA: Diagnosis not present

## 2021-03-14 DIAGNOSIS — N390 Urinary tract infection, site not specified: Secondary | ICD-10-CM | POA: Diagnosis not present

## 2021-03-14 DIAGNOSIS — Z0000 Encounter for general adult medical examination without abnormal findings: Secondary | ICD-10-CM | POA: Diagnosis not present

## 2021-03-14 DIAGNOSIS — R739 Hyperglycemia, unspecified: Secondary | ICD-10-CM | POA: Diagnosis not present

## 2021-03-14 DIAGNOSIS — I10 Essential (primary) hypertension: Secondary | ICD-10-CM | POA: Diagnosis not present

## 2021-03-15 DIAGNOSIS — E039 Hypothyroidism, unspecified: Secondary | ICD-10-CM | POA: Diagnosis not present

## 2021-03-15 DIAGNOSIS — Z0000 Encounter for general adult medical examination without abnormal findings: Secondary | ICD-10-CM | POA: Diagnosis not present

## 2021-03-15 DIAGNOSIS — I10 Essential (primary) hypertension: Secondary | ICD-10-CM | POA: Diagnosis not present

## 2021-03-15 DIAGNOSIS — N390 Urinary tract infection, site not specified: Secondary | ICD-10-CM | POA: Diagnosis not present

## 2021-03-15 DIAGNOSIS — R739 Hyperglycemia, unspecified: Secondary | ICD-10-CM | POA: Diagnosis not present

## 2021-03-15 DIAGNOSIS — Z96611 Presence of right artificial shoulder joint: Secondary | ICD-10-CM | POA: Diagnosis not present

## 2021-03-15 DIAGNOSIS — E559 Vitamin D deficiency, unspecified: Secondary | ICD-10-CM | POA: Diagnosis not present

## 2021-03-20 DIAGNOSIS — M25511 Pain in right shoulder: Secondary | ICD-10-CM | POA: Diagnosis not present

## 2021-03-22 DIAGNOSIS — I10 Essential (primary) hypertension: Secondary | ICD-10-CM | POA: Diagnosis not present

## 2021-03-22 DIAGNOSIS — E7800 Pure hypercholesterolemia, unspecified: Secondary | ICD-10-CM | POA: Diagnosis not present

## 2021-03-22 DIAGNOSIS — K219 Gastro-esophageal reflux disease without esophagitis: Secondary | ICD-10-CM | POA: Diagnosis not present

## 2021-03-22 DIAGNOSIS — Z0000 Encounter for general adult medical examination without abnormal findings: Secondary | ICD-10-CM | POA: Diagnosis not present

## 2021-03-22 DIAGNOSIS — E559 Vitamin D deficiency, unspecified: Secondary | ICD-10-CM | POA: Diagnosis not present

## 2021-03-22 DIAGNOSIS — H6123 Impacted cerumen, bilateral: Secondary | ICD-10-CM | POA: Diagnosis not present

## 2021-03-22 DIAGNOSIS — E039 Hypothyroidism, unspecified: Secondary | ICD-10-CM | POA: Diagnosis not present

## 2021-03-30 DIAGNOSIS — M25511 Pain in right shoulder: Secondary | ICD-10-CM | POA: Diagnosis not present

## 2021-04-03 DIAGNOSIS — M25511 Pain in right shoulder: Secondary | ICD-10-CM | POA: Diagnosis not present

## 2021-04-12 DIAGNOSIS — M25511 Pain in right shoulder: Secondary | ICD-10-CM | POA: Diagnosis not present

## 2021-04-14 DIAGNOSIS — M25511 Pain in right shoulder: Secondary | ICD-10-CM | POA: Diagnosis not present

## 2021-04-18 DIAGNOSIS — M25511 Pain in right shoulder: Secondary | ICD-10-CM | POA: Diagnosis not present

## 2021-04-20 DIAGNOSIS — M25511 Pain in right shoulder: Secondary | ICD-10-CM | POA: Diagnosis not present

## 2021-04-25 DIAGNOSIS — M25511 Pain in right shoulder: Secondary | ICD-10-CM | POA: Diagnosis not present

## 2021-04-27 DIAGNOSIS — M25511 Pain in right shoulder: Secondary | ICD-10-CM | POA: Diagnosis not present

## 2021-05-02 DIAGNOSIS — M25511 Pain in right shoulder: Secondary | ICD-10-CM | POA: Diagnosis not present

## 2021-05-04 DIAGNOSIS — M25511 Pain in right shoulder: Secondary | ICD-10-CM | POA: Diagnosis not present

## 2021-05-09 DIAGNOSIS — M25511 Pain in right shoulder: Secondary | ICD-10-CM | POA: Diagnosis not present

## 2021-05-18 DIAGNOSIS — M25511 Pain in right shoulder: Secondary | ICD-10-CM | POA: Diagnosis not present

## 2021-05-23 DIAGNOSIS — M25511 Pain in right shoulder: Secondary | ICD-10-CM | POA: Diagnosis not present

## 2021-05-26 DIAGNOSIS — Z96611 Presence of right artificial shoulder joint: Secondary | ICD-10-CM | POA: Diagnosis not present

## 2021-05-26 DIAGNOSIS — Z471 Aftercare following joint replacement surgery: Secondary | ICD-10-CM | POA: Diagnosis not present

## 2021-05-26 DIAGNOSIS — M25511 Pain in right shoulder: Secondary | ICD-10-CM | POA: Diagnosis not present

## 2021-05-26 DIAGNOSIS — Z4789 Encounter for other orthopedic aftercare: Secondary | ICD-10-CM | POA: Diagnosis not present

## 2021-06-09 DIAGNOSIS — M25511 Pain in right shoulder: Secondary | ICD-10-CM | POA: Diagnosis not present

## 2021-06-13 DIAGNOSIS — M25511 Pain in right shoulder: Secondary | ICD-10-CM | POA: Diagnosis not present

## 2021-06-20 DIAGNOSIS — M25511 Pain in right shoulder: Secondary | ICD-10-CM | POA: Diagnosis not present

## 2021-06-22 DIAGNOSIS — M25511 Pain in right shoulder: Secondary | ICD-10-CM | POA: Diagnosis not present

## 2021-06-27 DIAGNOSIS — M25511 Pain in right shoulder: Secondary | ICD-10-CM | POA: Diagnosis not present

## 2021-06-29 DIAGNOSIS — M25511 Pain in right shoulder: Secondary | ICD-10-CM | POA: Diagnosis not present

## 2021-07-05 DIAGNOSIS — M25511 Pain in right shoulder: Secondary | ICD-10-CM | POA: Diagnosis not present

## 2021-07-10 DIAGNOSIS — M25511 Pain in right shoulder: Secondary | ICD-10-CM | POA: Diagnosis not present

## 2021-07-12 DIAGNOSIS — M25511 Pain in right shoulder: Secondary | ICD-10-CM | POA: Diagnosis not present

## 2021-07-18 DIAGNOSIS — M25511 Pain in right shoulder: Secondary | ICD-10-CM | POA: Diagnosis not present

## 2021-07-21 DIAGNOSIS — M25511 Pain in right shoulder: Secondary | ICD-10-CM | POA: Diagnosis not present

## 2021-07-25 DIAGNOSIS — M25511 Pain in right shoulder: Secondary | ICD-10-CM | POA: Diagnosis not present

## 2021-08-02 DIAGNOSIS — Z96611 Presence of right artificial shoulder joint: Secondary | ICD-10-CM | POA: Diagnosis not present

## 2022-02-28 DIAGNOSIS — Z96611 Presence of right artificial shoulder joint: Secondary | ICD-10-CM | POA: Diagnosis not present

## 2022-03-21 DIAGNOSIS — Z0000 Encounter for general adult medical examination without abnormal findings: Secondary | ICD-10-CM | POA: Diagnosis not present

## 2022-03-21 DIAGNOSIS — I10 Essential (primary) hypertension: Secondary | ICD-10-CM | POA: Diagnosis not present

## 2022-03-21 DIAGNOSIS — E7800 Pure hypercholesterolemia, unspecified: Secondary | ICD-10-CM | POA: Diagnosis not present

## 2022-03-21 DIAGNOSIS — E039 Hypothyroidism, unspecified: Secondary | ICD-10-CM | POA: Diagnosis not present

## 2022-03-21 DIAGNOSIS — E559 Vitamin D deficiency, unspecified: Secondary | ICD-10-CM | POA: Diagnosis not present

## 2022-03-29 DIAGNOSIS — I10 Essential (primary) hypertension: Secondary | ICD-10-CM | POA: Diagnosis not present

## 2022-03-29 DIAGNOSIS — Z0000 Encounter for general adult medical examination without abnormal findings: Secondary | ICD-10-CM | POA: Diagnosis not present

## 2022-03-29 DIAGNOSIS — M25542 Pain in joints of left hand: Secondary | ICD-10-CM | POA: Diagnosis not present

## 2022-03-29 DIAGNOSIS — M25541 Pain in joints of right hand: Secondary | ICD-10-CM | POA: Diagnosis not present

## 2022-03-29 DIAGNOSIS — K219 Gastro-esophageal reflux disease without esophagitis: Secondary | ICD-10-CM | POA: Diagnosis not present

## 2022-03-29 DIAGNOSIS — E7800 Pure hypercholesterolemia, unspecified: Secondary | ICD-10-CM | POA: Diagnosis not present

## 2022-03-29 DIAGNOSIS — N3281 Overactive bladder: Secondary | ICD-10-CM | POA: Diagnosis not present

## 2022-03-29 DIAGNOSIS — E039 Hypothyroidism, unspecified: Secondary | ICD-10-CM | POA: Diagnosis not present

## 2022-03-29 DIAGNOSIS — E559 Vitamin D deficiency, unspecified: Secondary | ICD-10-CM | POA: Diagnosis not present

## 2022-04-20 DIAGNOSIS — M79641 Pain in right hand: Secondary | ICD-10-CM | POA: Diagnosis not present

## 2022-04-20 DIAGNOSIS — M13841 Other specified arthritis, right hand: Secondary | ICD-10-CM | POA: Diagnosis not present
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
## 2022-08-08 DIAGNOSIS — Z96611 Presence of right artificial shoulder joint: Secondary | ICD-10-CM | POA: Diagnosis not present

## 2022-12-11 DIAGNOSIS — H5213 Myopia, bilateral: Secondary | ICD-10-CM | POA: Diagnosis not present

## 2022-12-24 DIAGNOSIS — H26491 Other secondary cataract, right eye: Secondary | ICD-10-CM | POA: Diagnosis not present

## 2023-01-29 DIAGNOSIS — H26492 Other secondary cataract, left eye: Secondary | ICD-10-CM | POA: Diagnosis not present

## 2023-03-27 DIAGNOSIS — I10 Essential (primary) hypertension: Secondary | ICD-10-CM | POA: Diagnosis not present

## 2023-03-27 DIAGNOSIS — E7800 Pure hypercholesterolemia, unspecified: Secondary | ICD-10-CM | POA: Diagnosis not present

## 2023-03-27 DIAGNOSIS — Z0000 Encounter for general adult medical examination without abnormal findings: Secondary | ICD-10-CM | POA: Diagnosis not present

## 2023-03-27 DIAGNOSIS — E039 Hypothyroidism, unspecified: Secondary | ICD-10-CM | POA: Diagnosis not present

## 2023-03-27 DIAGNOSIS — E559 Vitamin D deficiency, unspecified: Secondary | ICD-10-CM | POA: Diagnosis not present

## 2023-04-03 DIAGNOSIS — N3281 Overactive bladder: Secondary | ICD-10-CM | POA: Diagnosis not present

## 2023-04-03 DIAGNOSIS — E559 Vitamin D deficiency, unspecified: Secondary | ICD-10-CM | POA: Diagnosis not present

## 2023-04-03 DIAGNOSIS — Z0000 Encounter for general adult medical examination without abnormal findings: Secondary | ICD-10-CM | POA: Diagnosis not present

## 2023-04-03 DIAGNOSIS — E039 Hypothyroidism, unspecified: Secondary | ICD-10-CM | POA: Diagnosis not present

## 2023-04-03 DIAGNOSIS — Z23 Encounter for immunization: Secondary | ICD-10-CM | POA: Diagnosis not present

## 2023-04-03 DIAGNOSIS — I700 Atherosclerosis of aorta: Secondary | ICD-10-CM | POA: Diagnosis not present

## 2023-04-03 DIAGNOSIS — K219 Gastro-esophageal reflux disease without esophagitis: Secondary | ICD-10-CM | POA: Diagnosis not present

## 2023-04-03 DIAGNOSIS — E7800 Pure hypercholesterolemia, unspecified: Secondary | ICD-10-CM | POA: Diagnosis not present

## 2023-04-03 DIAGNOSIS — I10 Essential (primary) hypertension: Secondary | ICD-10-CM | POA: Diagnosis not present

## 2023-09-11 DIAGNOSIS — H903 Sensorineural hearing loss, bilateral: Secondary | ICD-10-CM | POA: Diagnosis not present

## 2023-09-11 DIAGNOSIS — H90A32 Mixed conductive and sensorineural hearing loss, unilateral, left ear with restricted hearing on the contralateral side: Secondary | ICD-10-CM | POA: Diagnosis not present

## 2023-10-18 DIAGNOSIS — H524 Presbyopia: Secondary | ICD-10-CM | POA: Diagnosis not present
